# Patient Record
Sex: Female | Born: 1975 | Race: White | Hispanic: No | Marital: Married | Smoking: Never smoker
Health system: Southern US, Community
[De-identification: ages and names within clinical notes are randomized; demographics above are authoritative.]

## PROBLEM LIST (undated history)

## (undated) DIAGNOSIS — Z8739 Personal history of other diseases of the musculoskeletal system and connective tissue: Secondary | ICD-10-CM

## (undated) DIAGNOSIS — D649 Anemia, unspecified: Secondary | ICD-10-CM

## (undated) DIAGNOSIS — E739 Lactose intolerance, unspecified: Principal | ICD-10-CM

## (undated) DIAGNOSIS — D259 Leiomyoma of uterus, unspecified: Secondary | ICD-10-CM

## (undated) DIAGNOSIS — T7840XA Allergy, unspecified, initial encounter: Secondary | ICD-10-CM

## (undated) DIAGNOSIS — N6489 Other specified disorders of breast: Secondary | ICD-10-CM

## (undated) DIAGNOSIS — K621 Rectal polyp: Secondary | ICD-10-CM

## (undated) DIAGNOSIS — K529 Noninfective gastroenteritis and colitis, unspecified: Secondary | ICD-10-CM

## (undated) HISTORY — DX: Noninfective gastroenteritis and colitis, unspecified: K52.9

## (undated) HISTORY — DX: Anemia, unspecified: D64.9

## (undated) HISTORY — DX: Personal history of other diseases of the musculoskeletal system and connective tissue: Z87.39

## (undated) HISTORY — DX: Allergy, unspecified, initial encounter: T78.40XA

## (undated) HISTORY — PX: CYSTECTOMY: SUR359

## (undated) HISTORY — DX: Other specified disorders of breast: N64.89

## (undated) HISTORY — PX: URETHRAL CYST REMOVAL: SHX5128

## (undated) HISTORY — DX: Lactose intolerance, unspecified: E73.9

## (undated) HISTORY — DX: Leiomyoma of uterus, unspecified: D25.9

## (undated) HISTORY — DX: Rectal polyp: K62.1

---

## 2000-03-02 ENCOUNTER — Other Ambulatory Visit: Admission: RE | Admit: 2000-03-02 | Discharge: 2000-03-02 | Payer: Self-pay | Admitting: Obstetrics and Gynecology

## 2000-08-30 ENCOUNTER — Inpatient Hospital Stay (HOSPITAL_COMMUNITY): Admission: AD | Admit: 2000-08-30 | Discharge: 2000-09-01 | Payer: Self-pay | Admitting: Obstetrics and Gynecology

## 2000-09-29 ENCOUNTER — Other Ambulatory Visit: Admission: RE | Admit: 2000-09-29 | Discharge: 2000-09-29 | Payer: Self-pay | Admitting: Obstetrics and Gynecology

## 2001-10-06 ENCOUNTER — Other Ambulatory Visit: Admission: RE | Admit: 2001-10-06 | Discharge: 2001-10-06 | Payer: Self-pay | Admitting: Obstetrics and Gynecology

## 2003-03-07 ENCOUNTER — Other Ambulatory Visit: Admission: RE | Admit: 2003-03-07 | Discharge: 2003-03-07 | Payer: Self-pay | Admitting: Obstetrics and Gynecology

## 2003-05-04 ENCOUNTER — Emergency Department (HOSPITAL_COMMUNITY): Admission: EM | Admit: 2003-05-04 | Discharge: 2003-05-04 | Payer: Self-pay | Admitting: Emergency Medicine

## 2004-06-17 ENCOUNTER — Ambulatory Visit (HOSPITAL_BASED_OUTPATIENT_CLINIC_OR_DEPARTMENT_OTHER): Admission: RE | Admit: 2004-06-17 | Discharge: 2004-06-17 | Payer: Self-pay | Admitting: Urology

## 2004-06-17 ENCOUNTER — Ambulatory Visit (HOSPITAL_COMMUNITY): Admission: RE | Admit: 2004-06-17 | Discharge: 2004-06-17 | Payer: Self-pay | Admitting: Urology

## 2011-08-16 ENCOUNTER — Emergency Department (HOSPITAL_COMMUNITY)
Admission: EM | Admit: 2011-08-16 | Discharge: 2011-08-16 | Disposition: A | Discharge status: Home / Self Care | Payer: PRIVATE HEALTH INSURANCE | Attending: Emergency Medicine | Admitting: Emergency Medicine

## 2011-08-16 DIAGNOSIS — S0990XA Unspecified injury of head, initial encounter: Secondary | ICD-10-CM | POA: Insufficient documentation

## 2011-08-16 DIAGNOSIS — M546 Pain in thoracic spine: Secondary | ICD-10-CM | POA: Insufficient documentation

## 2011-08-16 DIAGNOSIS — S20229A Contusion of unspecified back wall of thorax, initial encounter: Secondary | ICD-10-CM | POA: Insufficient documentation

## 2011-08-16 DIAGNOSIS — Y9210 Unspecified residential institution as the place of occurrence of the external cause: Secondary | ICD-10-CM | POA: Insufficient documentation

## 2011-08-16 DIAGNOSIS — S1093XA Contusion of unspecified part of neck, initial encounter: Secondary | ICD-10-CM | POA: Insufficient documentation

## 2011-08-16 DIAGNOSIS — M542 Cervicalgia: Secondary | ICD-10-CM | POA: Insufficient documentation

## 2011-08-16 DIAGNOSIS — Y990 Civilian activity done for income or pay: Secondary | ICD-10-CM | POA: Insufficient documentation

## 2011-08-16 DIAGNOSIS — IMO0002 Reserved for concepts with insufficient information to code with codable children: Secondary | ICD-10-CM | POA: Insufficient documentation

## 2011-08-16 DIAGNOSIS — S0003XA Contusion of scalp, initial encounter: Secondary | ICD-10-CM | POA: Insufficient documentation

## 2012-01-05 ENCOUNTER — Encounter: Payer: Self-pay | Admitting: Internal Medicine

## 2012-01-07 ENCOUNTER — Other Ambulatory Visit (INDEPENDENT_AMBULATORY_CARE_PROVIDER_SITE_OTHER): Payer: BC Managed Care – PPO

## 2012-01-07 ENCOUNTER — Encounter: Payer: Self-pay | Admitting: Internal Medicine

## 2012-01-07 ENCOUNTER — Ambulatory Visit (INDEPENDENT_AMBULATORY_CARE_PROVIDER_SITE_OTHER): Payer: BC Managed Care – PPO | Admitting: Internal Medicine

## 2012-01-07 DIAGNOSIS — K921 Melena: Secondary | ICD-10-CM

## 2012-01-07 LAB — CBC WITH DIFFERENTIAL/PLATELET
Eosinophils Relative: 13.3 % — ABNORMAL HIGH (ref 0.0–5.0)
HCT: 34.8 % — ABNORMAL LOW (ref 36.0–46.0)
Hemoglobin: 11.4 g/dL — ABNORMAL LOW (ref 12.0–15.0)
Lymphs Abs: 1.6 10*3/uL (ref 0.7–4.0)
Monocytes Relative: 13.4 % — ABNORMAL HIGH (ref 3.0–12.0)
Neutro Abs: 5.3 10*3/uL (ref 1.4–7.7)
Platelets: 321 10*3/uL (ref 150.0–400.0)
WBC: 9.5 10*3/uL (ref 4.5–10.5)

## 2012-01-07 MED ORDER — PEG-KCL-NACL-NASULF-NA ASC-C 100 G PO SOLR: 1.0000 | Freq: Once | ORAL | Status: DC

## 2012-01-07 NOTE — Patient Instructions (Signed)
Please go to the basement upon leaving today to have your labs done. You have been scheduled for a Colonoscopy with separate instructions given. Your prep kit has been sent to your pharmacy for you to pick up.

## 2012-01-07 NOTE — Progress Notes (Signed)
Quick Note:  Let her know Hgb result - slightly low. Not a major issue. Will discuss more at colonoscopy ______

## 2012-01-07 NOTE — Progress Notes (Signed)
Subjective:    Patient ID: Gloria Fox, female    DOB: [birth year], 36 y.o.   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 It started in November, and became more frequent. It's really almost a daily feeding where she has some urgent defecation with mucus and slightly loose stools and blood mixed in. The blood has been red in color. There is no report of melena. She has had some constipation type sensation to times. There's been some mild crampy lower quadrant abdominal pain. She reports a history of hemorrhoids since childbirth, most recently in 2001, with some minor rectal bleeding but nothing like this in the past. Her GI review of systems is otherwise negative, without nausea or vomiting, weight changes, or other significant constitutional symptoms. She has some mild ankle pain, she has some chronic low back pain which he attributes to think she doesn't work but there is no history of rash, ocular inflammation or true arthritis.  Allergies  Allergen Reactions  . Keflex   . Sulfa Drugs Cross Reactors    No outpatient prescriptions prior to visit.   No past medical history on file. Past Surgical History  Procedure Date  . Cystectomy    History   Social History  . Marital Status: Married    Spouse Name: N/A    Number of Children: 2       Occupational History  . Nurse [employer]   Social History Main Topics  . Smoking status: Never Smoker   . Smokeless tobacco: Never Used  . Alcohol Use: No  . Drug Use: No  .            Social History Narrative   Daily caffeine    Family History  Problem Relation Age of Onset  . Colon cancer Neg Hx          Review of Systems She did recently have a URI syndrome with fever cough and headaches but no myalgias. She reports an allergy issues at times. She wears eyeglasses. All other review of systems negative  or as per the history of present illness.    Objective:   Physical Exam General:  Well-developed, well-nourished and in no acute distress Eyes:  anicteric. ENT:   Mouth and posterior pharynx free of lesions.  Neck:   supple w/o thyromegaly or mass.  Lungs: Clear to auscultation bilaterally. Heart:  S1S2, no rubs, murmurs, gallops. Abdomen:  soft, non-tender, no hepatosplenomegaly, hernia, or mass and BS+.  Rectal: Deferred until colonoscopy Lymph:  no cervical or supraclavicular adenopathy. Extremities:   no edema Skin   no rash. Neuro:  A&O x 3.  Psych:  appropriate mood and  Affect.       Assessment & Plan:   1. Blood in stool    This is associated with some change in bowel habits with mucus production. It sounds like a small volume bleeding issue. The differential diagnosis includes inflammatory bowel disease, it sounds like she at least has a proctitis. She could have some associated hemorrhoidal bleeding as well but the mucus and change in bowels is suggestive of a proctitis and possible colitis. Colorectal neoplasia is in the differential as well but seems much less likely.  We have discussed the options of flexible sigmoidoscopy versus colonoscopy in the information and risks associated with each. Colonoscopy with possible terminal ileum inspection has been  decided upon. She understands the risks benefits and indications and agrees to proceed. We'll also check a CBC at this point. Depending upon what is found he could need more investigation but I think this is enough for now.

## 2012-01-12 ENCOUNTER — Ambulatory Visit (AMBULATORY_SURGERY_CENTER): Payer: BC Managed Care – PPO | Admitting: Internal Medicine

## 2012-01-12 ENCOUNTER — Encounter: Payer: Self-pay | Admitting: Internal Medicine

## 2012-01-12 ENCOUNTER — Other Ambulatory Visit (INDEPENDENT_AMBULATORY_CARE_PROVIDER_SITE_OTHER): Payer: BC Managed Care – PPO

## 2012-01-12 VITALS — BP 141/70 | HR 120 | Temp 100.6°F | Resp 20 | Ht 61.0 in | Wt 112.0 lb

## 2012-01-12 DIAGNOSIS — K5190 Ulcerative colitis, unspecified, without complications: Secondary | ICD-10-CM

## 2012-01-12 DIAGNOSIS — K921 Melena: Secondary | ICD-10-CM

## 2012-01-12 DIAGNOSIS — K620 Anal polyp: Secondary | ICD-10-CM

## 2012-01-12 DIAGNOSIS — K5150 Left sided colitis without complications: Secondary | ICD-10-CM

## 2012-01-12 DIAGNOSIS — K621 Rectal polyp: Secondary | ICD-10-CM

## 2012-01-12 DIAGNOSIS — K529 Noninfective gastroenteritis and colitis, unspecified: Secondary | ICD-10-CM

## 2012-01-12 DIAGNOSIS — D126 Benign neoplasm of colon, unspecified: Secondary | ICD-10-CM

## 2012-01-12 HISTORY — PX: COLONOSCOPY: SHX174

## 2012-01-12 HISTORY — DX: Rectal polyp: K62.1

## 2012-01-12 HISTORY — DX: Noninfective gastroenteritis and colitis, unspecified: K52.9

## 2012-01-12 LAB — COMPREHENSIVE METABOLIC PANEL
ALT: 9 U/L (ref 0–35)
AST: 15 U/L (ref 0–37)
Albumin: 2.5 g/dL — ABNORMAL LOW (ref 3.5–5.2)
Alkaline Phosphatase: 49 U/L (ref 39–117)
Potassium: 3.2 mEq/L — ABNORMAL LOW (ref 3.5–5.1)
Sodium: 137 mEq/L (ref 135–145)
Total Protein: 5.1 g/dL — ABNORMAL LOW (ref 6.0–8.3)

## 2012-01-12 MED ORDER — MESALAMINE 800 MG PO TBEC: 2.0000 | DELAYED_RELEASE_TABLET | Freq: Three times a day (TID) | ORAL | Status: DC

## 2012-01-12 MED ORDER — SODIUM CHLORIDE 0.9 % IV SOLN: 500.0000 mL | INTRAVENOUS | Status: DC

## 2012-01-12 NOTE — Patient Instructions (Addendum)
The colonoscopy shows what I think is left-sided ulcerative colitis. I think it makes sense to start treatment and I will also notify you when the biopsies return. Asacol HD was prescribed. You will have a CMET today also. Please read the information provided today. Gatha Mayer, MD, FACG   YOU HAD AN ENDOSCOPIC PROCEDURE TODAY AT [facility]: Refer to the procedure report that was given to you for any specific questions about what was found during the examination.  If the procedure report does not answer your questions, please call your gastroenterologist to clarify.  If you requested that your care partner not be given the details of your procedure findings, then the procedure report has been included in a sealed envelope for you to review at your convenience later.  YOU SHOULD EXPECT: Some feelings of bloating in the abdomen. Passage of more gas than usual.  Walking can help get rid of the air that was put into your GI tract during the procedure and reduce the bloating. If you had a lower endoscopy (such as a colonoscopy or flexible sigmoidoscopy) you may notice spotting of blood in your stool or on the toilet paper. If you underwent a bowel prep for your procedure, then you may not have a normal bowel movement for a few days.  DIET: Your first meal following the procedure should be a light meal and then it is ok to progress to your normal diet.  A half-sandwich or bowl of soup is an example of a good first meal.  Heavy or fried foods are harder to digest and may make you feel nauseous or bloated.  Likewise meals heavy in dairy and vegetables can cause extra gas to form and this can also increase the bloating.  Drink plenty of fluids but you should avoid alcoholic beverages for 24 hours.  ACTIVITY: Your care partner should take you home directly after the procedure.  You should plan to take it easy, moving slowly for the rest of the day.  You can resume normal activity the day after  the procedure however you should NOT DRIVE or use heavy machinery for 24 hours (because of the sedation medicines used during the test).    SYMPTOMS TO REPORT IMMEDIATELY: A gastroenterologist can be reached at any hour.  During normal business hours, 8:30 AM to 5:00 PM Monday through Friday, call [phone].  After hours and on weekends, please call the GI answering service at [phone] who will take a message and have the physician on call contact you.   Following lower endoscopy (colonoscopy or flexible sigmoidoscopy):  Excessive amounts of blood in the stool  Significant tenderness or worsening of abdominal pains  Swelling of the abdomen that is new, acute  Fever of 100F or higher  Following upper endoscopy (EGD)  Vomiting of blood or coffee ground material  New chest pain or pain under the shoulder blades  Painful or persistently difficult swallowing  New shortness of breath  Fever of 100F or higher  Black, tarry-looking stools  FOLLOW UP: If any biopsies were taken you will be contacted by phone or by letter within the next 1-3 weeks.  Call your gastroenterologist if you have not heard about the biopsies in 3 weeks.  Our staff will call the home number listed on your records the next business day following your procedure to check on you and address any questions or concerns that you may have at that time regarding the information given to you  following your procedure. This is a courtesy call and so if there is no answer at the home number and we have not heard from you through the emergency physician on call, we will assume that you have returned to your regular daily activities without incident.  SIGNATURES/CONFIDENTIALITY: You and/or your care partner have signed paperwork which will be entered into your electronic medical record.  These signatures attest to the fact that that the information above on your After Visit Summary has been reviewed and is understood.  Full  responsibility of the confidentiality of this discharge information lies with you and/or your care-partner.    Ulcerative Colitis Ulcerative colitis is a long lasting swelling and soreness (inflammation) of the colon (large intestine). In patients with ulcerative colitis, sores (ulcers) and inflammation of the inner lining of the colon lead to illness. Ulcerative colitis can also cause problems outside the digestive tract.  Ulcerative colitis is closely related to another condition of inflammation of the intestines called Crohn's disease. Together, they are frequently referred to as inflammatory bowel disease (IBD). Ulcerative colitis and Crohn's diseases are conditions that can last years to decades. Men and women are affected equally. They most commonly begin during adolescence and early adulthood. SYMPTOMS  Common symptoms of ulcerative colitis include rectal bleeding and diarrhea. There is a wide range of symptoms among patients with this disease depending on how severe the disease is. Some of these symptoms are:  Abdominal pain or cramping.   Diarrhea.   Fever.   Tiredness (fatigue).   Weight loss.   Night sweats.   Rectal pain.   Feeling the immediate need to have a bowel movement (rectal urgency).  CAUSES  Ulcerative colitis is caused by increased activity of the immune system in the intestines. The immune system is the system that protects the body against disease such as harmful bacteria, viruses, fungi, and other foreign invaders. When the immune system overacts, it causes inflammation. The cause of the increased immune system activity is not known. This over activity causes long-lasting inflammation and ulceration. This condition may be passed down from your parents (inherited). Brothers, sisters, children, and parents of patients with IBD are more likely to develop these diseases. It is not contagious. This means you cannot catch it from someone else. DIAGNOSIS  Your caregiver  may suspect ulcerative colitis based on your symptoms and exam. Blood tests may confirm that there is a problem. You may be asked to submit a stool specimen for examination. X-rays and CT scans may be necessary. Ultimately, the diagnosis is usually made after a flexible tube is inserted via your anus and your colon is examined under sedation (colonoscopy). With this test, the specialist can take a tiny tissue sample from inside the bowel (biopsy). Examination of this biopsy tissue under a microscopy can reveal ulcerative colitis as the cause of your symptoms. TREATMENT   There is no cure for ulcerative colitis.   Complications such as massive bleeding from the colon (hemorrhage), development of a hole in the colon (perforation), or the development of precancerous or cancerous changes of the colon may require surgery.   Medications are often used to decrease inflammation and control the immune system. These include medicines related to aspirin, steroid medications, and newer and stronger medications to slow down the immune system. Some medications may be used as suppositories or enemas. A number of other medications are used or have been studied. Your caregiver will make specific recommendations.  HOME CARE INSTRUCTIONS   There is  no cure for ulcerative colitis disease. The best treatment is frequent checkups with your caregiver. Periodic reevaluation is important.   Symptoms such as diarrhea can be controlled with medications. Avoid foods that have a laxative effect such fresh fruit and vegetables and dairy products. During flare ups, you can rest your bowel by staying away from solid foods. Drink clear liquids frequently during the day. Electrolyte or rehydrating fluids are best. Your caregiver can help you with suggestions. Drink often to prevent dehydration. When diarrhea has cleared, eat smaller meals and more often. Avoid food additives and stimulants such as caffeine (coffee, tea, many sodas, or  chocolate). Avoid dairy products. Enzyme supplements may help if you develop intolerance to a sugar in dairy products (lactose). Ask your caregiver or dietitian about specific dietary instructions.   If you had surgery, be sure you understand your care instructions thoroughly, including proper care of any surgical wounds.   Take any medications exactly as prescribed.   Try to maintain a positive attitude. Learn relaxation techniques such as self hypnosis, mental imaging, and muscle relaxation. If possible, avoid stresses that aggravate your condition. Exercise regularly. Follow your diet. Always get plenty of rest.  SEEK MEDICAL CARE IF:   Your symptoms fail to improve after a week or two of new treatment.   You experience continued weight loss.   You have ongoing crampy digestion or loose bowels.   You develop a new skin rash, skin sores, or eye problems.  SEEK IMMEDIATE MEDICAL CARE IF:   You have worsening of your symptoms or develop new symptoms.   You have an oral temperature above 102 F (38.9 C), not controlled by medicine.   You develop bloody diarrhea.   You have severe abdominal pain.  Document Released: 08/18/2005 Document Revised: 07/21/2011 Document Reviewed: 07/18/2007 [facility] Patient Information 2012 [place].  You will have labs drawn today on discharge.   Colon Polyps A polyp is extra tissue that grows inside your body. Colon polyps grow in the large intestine. The large intestine, also called the colon, is part of your digestive system. It is a long, hollow tube at the end of your digestive tract where your body makes and stores stool. Most polyps are not dangerous. They are benign. This means they are not cancerous. But over time, some types of polyps can turn into cancer. Polyps that are smaller than a pea are usually not harmful. But larger polyps could someday become or may already be cancerous. To be safe, doctors remove all polyps and test them.  WHO  GETS POLYPS? Anyone can get polyps, but certain people are more likely than others. You may have a greater chance of getting polyps if:  You are over 50.   You have had polyps before.   Someone in your family has had polyps.   Someone in your family has had cancer of the large intestine.   Find out if someone in your family has had polyps. You may also be more likely to get polyps if you:   Eat a lot of fatty foods.   Smoke.   Drink alcohol.   Do not exercise.   Eat too much.  SYMPTOMS  Most small polyps do not cause symptoms. People often do not know they have one until their caregiver finds it during a regular checkup or while testing them for something else. Some people do have symptoms like these:  Bleeding from the anus. You might notice blood on your underwear or on  toilet paper after you have had a bowel movement.   Constipation or diarrhea that lasts more than a week.   Blood in the stool. Blood can make stool look black or it can show up as red streaks in the stool.  If you have any of these symptoms, see your caregiver. HOW DOES THE DOCTOR TEST FOR POLYPS? The doctor can use four tests to check for polyps:  Digital rectal exam. The caregiver wears gloves and checks your rectum (the last part of the large intestine) to see if it feels normal. This test would find polyps only in the rectum. Your caregiver may need to do one of the other tests listed below to find polyps higher up in the intestine.   Barium enema. The caregiver puts a liquid called barium into your rectum before taking x-rays of your large intestine. Barium makes your intestine look white in the pictures. Polyps are dark, so they are easy to see.   Sigmoidoscopy. With this test, the caregiver can see inside your large intestine. A thin flexible tube is placed into your rectum. The device is called a sigmoidoscope, which has a light and a tiny video camera in it. The caregiver uses the sigmoidoscope to  look at the last third of your large intestine.   Colonoscopy. This test is like sigmoidoscopy, but the caregiver looks at all of the large intestine. It usually requires sedation. This is the most common method for finding and removing polyps.  TREATMENT   The caregiver will remove the polyp during sigmoidoscopy or colonoscopy. The polyp is then tested for cancer.   If you have had polyps, your caregiver may want you to get tested regularly in the future.  PREVENTION  There is not one sure way to prevent polyps. You might be able to lower your risk of getting them if you:  Eat more fruits and vegetables and less fatty food.   Do not smoke.   Avoid alcohol.   Exercise every day.   Lose weight if you are overweight.   Eating more calcium and folate can also lower your risk of getting polyps. Some foods that are rich in calcium are milk, cheese, and broccoli. Some foods that are rich in folate are chickpeas, kidney beans, and spinach.   Aspirin might help prevent polyps. Studies are under way.  Document Released: 08/04/2004 Document Revised: 07/21/2011 Document Reviewed: 01/10/2008 [facility] Patient Information 2012 [place].

## 2012-01-12 NOTE — Progress Notes (Signed)
Patient did not experience any of the following events: a burn prior to discharge; a fall within the facility; wrong site/side/patient/procedure/implant event; or a hospital transfer or hospital admission upon discharge from the facility. (G8907) Patient did not have preoperative order for IV antibiotic SSI prophylaxis. (G8918)  

## 2012-01-12 NOTE — Op Note (Signed)
[facility] [address]. [place], [place]  [address]  COLONOSCOPY PROCEDURE REPORT  PATIENT:  Gloria Fox, Gloria Fox  MR#:  [id number] BIRTHDATE:  03-14-1976, 35 yrs. old  GENDER:  female ENDOSCOPIST:  Gatha Mayer, MD, [facility]  PROCEDURE DATE:  01/12/2012 PROCEDURE:  Colonoscopy with biopsy ASA CLASS:  Class I INDICATIONS:  hematochezia MEDICATIONS:   These medications were titrated to patient response per physician's verbal order, Fentanyl 100 mcg IV, Versed 9 mg IV  DESCRIPTION OF PROCEDURE:   After the risks benefits and alternatives of the procedure were thoroughly explained, informed consent was obtained.  Digital rectal exam was performed and revealed no rectal masses and tender.   The LB [id number] endoscope was introduced through the anus and advanced to the terminal ileum which was intubated for a short distance, without limitations.  The quality of the prep was adequate, using MoviPrep.  The instrument was then slowly withdrawn as the colon was fully examined. <<PROCEDUREIMAGES>>  FINDINGS:  Colitis was found in the left colon. Anal verge to about 60 cm. Confluent ulcerations, edema, erythema and minimal contact friability. Multiple biopsies were obtained and sent to pathology.  A sessile polyp was found in the rectum. 1 cm - soft, fleshy and much removed with biopsy. Suspect inflammatory.  The terminal ileum appeared normal.  This was otherwise a normal examination of the colon. Random biopsies were obtained and sent to pathology.   Retroflexed views in the rectum revealed it was not tolerated by the patient.    The time to cecum =   4:48 minutes. The scope was then withdrawn in 7:45 minutes from the cecum and the procedure completed. COMPLICATIONS:  None ENDOSCOPIC IMPRESSION: 1) Colitis in the left colon - looks like left-sided ulcerative colitis 2) Sessile polyp in the rectum - suspect inflammatoty 3) Normal terminal ileum 4) Otherwise normal  examination RECOMMENDATIONS: 1) start Asacol HD 1600 mg tid 2) CMET today 3) will contact with results and plans - anticipate office visit in 6-8 weeks  Gatha Mayer, MD, [facility]  CC:  The Patient  n. eSIGNED:   Gatha Mayer at 01/12/2012 04:19 PM  Oleta Mouse, [phone]

## 2012-01-13 ENCOUNTER — Telehealth: Payer: Self-pay | Admitting: *Deleted

## 2012-01-13 NOTE — Telephone Encounter (Signed)
  Follow up Call-  Call back number 01/12/2012  Post procedure Call Back phone  # [phone]  Permission to leave phone message Yes     Patient questions:  Do you have a fever, pain , or abdominal swelling? no Pain Score  0 *  Have you tolerated food without any problems? yes  Have you been able to return to your normal activities? yes  Do you have any questions about your discharge instructions: Diet   no Medications  no Follow up visit  no  Do you have questions or concerns about your Care? no  Actions: * If pain score is 4 or above: No action needed, pain <4.

## 2012-01-13 NOTE — Progress Notes (Signed)
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 Bananas) Albumin and protein are low from chronic diarrhea/UC   ______

## 2012-01-17 NOTE — Progress Notes (Signed)
Quick Note:  Office -- please call patient with info below  Bxs show left-sided ulcerative colitis and inflammatory polyp  How is she doing? Any better? (probably not but checking to see ) - has a school trip to [place] March 11 so ? If needs faster-acting tx  She does need a 6-8 week appt  [facility] - no letter, no recall at this time ______

## 2012-01-18 ENCOUNTER — Other Ambulatory Visit: Payer: Self-pay

## 2012-01-18 DIAGNOSIS — K5150 Left sided colitis without complications: Secondary | ICD-10-CM

## 2012-01-18 MED ORDER — MESALAMINE 800 MG PO TBEC: 2.0000 | DELAYED_RELEASE_TABLET | Freq: Three times a day (TID) | ORAL | Status: DC

## 2012-01-18 MED ORDER — PREDNISONE (PAK) 10 MG PO TABS: ORAL_TABLET | ORAL | Status: DC

## 2012-01-18 NOTE — Progress Notes (Signed)
Quick Note:  Prednisone 10 mg tablets   4/day x 5 days, 3/day x 5 days, 2/day x 10 days, 1/day x 10 days, 1/2/day x 10 days and stop. Rx # enough to cover this  If not helping her in next 1-2 weeks call back sooner ______

## 2012-03-03 ENCOUNTER — Ambulatory Visit (INDEPENDENT_AMBULATORY_CARE_PROVIDER_SITE_OTHER): Payer: BC Managed Care – PPO | Admitting: Internal Medicine

## 2012-03-03 ENCOUNTER — Encounter: Payer: Self-pay | Admitting: Internal Medicine

## 2012-03-03 VITALS — BP 108/68 | HR 80 | Ht 62.0 in | Wt 115.0 lb

## 2012-03-03 DIAGNOSIS — K5150 Left sided colitis without complications: Secondary | ICD-10-CM

## 2012-03-03 NOTE — Progress Notes (Signed)
Patient ID: Gloria Fox, female   DOB: 08-04-1976, 36 y.o.   MRN: [id number]   Hx:  The patient returns for followup of left-sided ulcerative colitis that was diagnosed at colonoscopy in February 2013. She was started on Asacol HD 1600 mg 3 times a day. She also took a brief course of prednisone because of acute symptoms with severe diarrhea. She responded nicely to both of these, she was able to go on a field trip with her daughter back in March. At this point she has 1 or 2 formed stools a day with slight mucus production. When she's had a large stool she's had a streak of blood seen. No other bleeding. Note, pain. Energy level is good and she feels essentially back to normal.  She has no other complaints today.  Medications, allergies, past medical history, past surgical history, family history and social history are reviewed and updated in the EMR.  As until exam not performed today.  Assessment and plan:  Left sided ulcerative colitis, improved. See problem oriented charting as well. Anticipate routine followup in one year, sooner as needed.

## 2012-03-03 NOTE — Assessment & Plan Note (Signed)
Other than occasional mucus and a rare streak of red blood with a hard stool, she is doing very well. It sounds like she is in or very close to remission. We'll continue Asacol H D. at 4.8 g daily. We discussed that she might get by with a lower dose but would not consider reducing the dose until later this year. I have reviewed the nature of the illness, she has read on her own. Crohn's and colitis foundation member ship benefit provided to the patient for further information.  She does not need bone density screening or any other special measures in her situation. Routine colorectal screening would be appropriate around age 62 for her and she has left-sided disease. Risk of progression of disease explained. We did discuss possible flexible sigmoidoscopy in the future to confirm disease control. She is open to that idea. I told her that it is not a standard of care but there are differing opinions on the appropriateness of that.

## 2012-03-03 NOTE — Patient Instructions (Signed)
You have been given a prescription for one year complimentary [facility] membership for your Ulcerative Colitis.  Follow-up with [place] in one year or sooner if needed.

## 2012-08-04 ENCOUNTER — Telehealth: Payer: Self-pay | Admitting: Internal Medicine

## 2012-08-04 MED ORDER — BUDESONIDE 9 MG PO TB24: 9.0000 mg | ORAL_TABLET | Freq: Every day | ORAL | Status: DC

## 2012-08-04 NOTE — Telephone Encounter (Signed)
I recommend Uceris 9 mg daily - helps like prednisone but without as many side effects # 30 1 refill If too expensive will need to do prednisone - 30 mg x 5 days 20 mg x 5 days 10 mg x 10 days 5 mg x 10 days  Office visit with me in 1 month - call back sooner if not responding

## 2012-08-04 NOTE — Telephone Encounter (Signed)
Patient aware.  REV scheduled for 09/06/12.  She will call back for any questions

## 2012-08-04 NOTE — Telephone Encounter (Signed)
Patient reports that she had a 3 week hx of mucous and blood in her stool.  The is also experiencing LLQ pain.  She is currently on  Asacol1600 mg TID.  She reports that she recently lost her Grandfather and these symptoms started around that time.  Dr. [name] please advise

## 2012-08-22 ENCOUNTER — Telehealth: Payer: Self-pay | Admitting: Internal Medicine

## 2012-08-22 DIAGNOSIS — R197 Diarrhea, unspecified: Secondary | ICD-10-CM

## 2012-08-22 MED ORDER — PREDNISONE 10 MG PO TABS: ORAL_TABLET | ORAL | Status: DC

## 2012-08-22 NOTE — Telephone Encounter (Signed)
Have her use the prednisone taper I wrote on last note (stop Uceris) Also ask her to do a C diff PCR

## 2012-08-22 NOTE — Telephone Encounter (Signed)
Patient reports that diarrhea has gotten worse since starting on Uceris.  She is having 3-4 loose stools every am and there is still some bleeding. See telephone call from 08/04/12 Dr. [name] please advise.

## 2012-08-22 NOTE — Telephone Encounter (Signed)
Patient aware rx sent She will come for stool studies

## 2012-09-05 ENCOUNTER — Other Ambulatory Visit: Payer: BC Managed Care – PPO

## 2012-09-05 DIAGNOSIS — R197 Diarrhea, unspecified: Secondary | ICD-10-CM

## 2012-09-06 ENCOUNTER — Ambulatory Visit (INDEPENDENT_AMBULATORY_CARE_PROVIDER_SITE_OTHER): Payer: BC Managed Care – PPO | Admitting: Internal Medicine

## 2012-09-06 ENCOUNTER — Encounter: Payer: Self-pay | Admitting: Internal Medicine

## 2012-09-06 VITALS — BP 92/60 | HR 68 | Ht 62.0 in | Wt 118.6 lb

## 2012-09-06 DIAGNOSIS — K5150 Left sided colitis without complications: Secondary | ICD-10-CM

## 2012-09-06 MED ORDER — MESALAMINE 1000 MG RE SUPP: 1000.0000 mg | Freq: Every day | RECTAL | Status: DC

## 2012-09-06 MED ORDER — PREDNISONE 10 MG PO TABS: ORAL_TABLET | ORAL | Status: DC

## 2012-09-06 NOTE — Patient Instructions (Addendum)
We have sent the following medications to your pharmacy for you to pick up at your convenience: Canasa suppository.  Take your Prednisone as follows:  71m every day for 2 weeks, 130mevery day for 2 weeks, 5 mg every day for 2 weeks then stop.  Thank you for choosing [facility] and [facility].  CaGatha MayerM.D., FAN[facility]

## 2012-09-06 NOTE — Progress Notes (Signed)
  Subjective:    Patient ID: Gloria Fox, female    DOB: June 18, 1976, 36 y.o.   MRN: [id number]  HPI The patient presents for followup of left-sided ulcerative colitis. She had been well since the spring. Within the past several weeks she developed diarrhea and rectal bleeding. She says it has been a stressful time, she was driving to [facility] and caring for a grandparent who is dying And she was quite busy. Now her husband's grandfather is also ill but is now moved to hospice that [facility] place and she thinks distress is less with that but still there. She was started on budesonide but that did not seem to help much. He was still having diarrhea so she was switched to prednisone. A relatively short tapered and is now on 10 mg daily about 2 weeks into that. She is not having diarrhea but is seeing some slight blood at times intermittently. Still having some crampy lower quadrant pain.   Review of Systems As above    Objective:   Physical Exam Well-developed well-nourished young white woman in no acute distress Abdomen is soft seems nontender today no organomegaly or mass       Assessment & Plan:   1. Left sided ulcerative (chronic) colitis with flare, related to recent stressors. She is on maximal mesalamine at 4.8 g daily and is generally compliant she says. Especially now. She is improved on prednisone but the taper was probably too quickly. Will prolong this using 20 mg daily for 2 weeks then 10 mg daily for 2 weeks and then 5 mg daily. I will also add Canasa suppository nightly. If this fails or she has more problems we may need to go to immune modulator therapy. We have discussed azathioprine and mercaptopurine to some degree. She will followup as needed at this point she understands the plan and can call or contact us by my chart.

## 2012-10-23 ENCOUNTER — Other Ambulatory Visit: Payer: Self-pay

## 2012-10-23 DIAGNOSIS — K5150 Left sided colitis without complications: Secondary | ICD-10-CM

## 2012-10-23 MED ORDER — MESALAMINE 1000 MG RE SUPP: 1000.0000 mg | Freq: Every day | RECTAL | Status: DC

## 2012-10-23 NOTE — Telephone Encounter (Signed)
Ok per Dr. [name], rx form faxed back to [facility] at [phone].

## 2013-01-06 ENCOUNTER — Other Ambulatory Visit: Payer: Self-pay

## 2013-02-26 ENCOUNTER — Other Ambulatory Visit: Payer: Self-pay

## 2013-02-26 DIAGNOSIS — K5150 Left sided colitis without complications: Secondary | ICD-10-CM

## 2013-02-26 MED ORDER — MESALAMINE 800 MG PO TBEC: 2.0000 | DELAYED_RELEASE_TABLET | Freq: Three times a day (TID) | ORAL | Status: DC

## 2013-02-26 NOTE — Telephone Encounter (Signed)
Faxed refill ok back on 02-23-13 for the Asacol HD 846m, ok per Dr. CMady Gemma Form sent down to be scanned in.

## 2013-09-27 ENCOUNTER — Other Ambulatory Visit: Payer: Self-pay

## 2013-10-24 ENCOUNTER — Ambulatory Visit: Payer: PRIVATE HEALTH INSURANCE | Attending: Occupational Medicine | Admitting: Physical Therapy

## 2013-10-24 DIAGNOSIS — M25519 Pain in unspecified shoulder: Secondary | ICD-10-CM | POA: Insufficient documentation

## 2013-10-24 DIAGNOSIS — IMO0001 Reserved for inherently not codable concepts without codable children: Secondary | ICD-10-CM | POA: Insufficient documentation

## 2013-10-30 ENCOUNTER — Encounter: Payer: PRIVATE HEALTH INSURANCE | Admitting: Rehabilitation

## 2013-10-30 ENCOUNTER — Ambulatory Visit: Payer: PRIVATE HEALTH INSURANCE | Admitting: Physical Therapy

## 2013-11-01 ENCOUNTER — Ambulatory Visit: Payer: PRIVATE HEALTH INSURANCE | Admitting: Rehabilitation

## 2013-11-06 ENCOUNTER — Ambulatory Visit: Payer: PRIVATE HEALTH INSURANCE | Admitting: Rehabilitation

## 2013-11-13 ENCOUNTER — Ambulatory Visit: Payer: PRIVATE HEALTH INSURANCE | Admitting: Physical Therapy

## 2013-11-20 ENCOUNTER — Ambulatory Visit: Payer: PRIVATE HEALTH INSURANCE | Admitting: Rehabilitation

## 2014-04-03 ENCOUNTER — Other Ambulatory Visit (INDEPENDENT_AMBULATORY_CARE_PROVIDER_SITE_OTHER): Payer: BC Managed Care – PPO

## 2014-04-03 ENCOUNTER — Ambulatory Visit (INDEPENDENT_AMBULATORY_CARE_PROVIDER_SITE_OTHER): Payer: BC Managed Care – PPO | Admitting: Internal Medicine

## 2014-04-03 ENCOUNTER — Encounter: Payer: Self-pay | Admitting: Internal Medicine

## 2014-04-03 VITALS — BP 118/78 | HR 78 | Ht 61.0 in | Wt 116.2 lb

## 2014-04-03 DIAGNOSIS — K5150 Left sided colitis without complications: Secondary | ICD-10-CM

## 2014-04-03 LAB — CBC WITH DIFFERENTIAL/PLATELET
BASOS ABS: 0 10*3/uL (ref 0.0–0.1)
Basophils Relative: 0.5 % (ref 0.0–3.0)
EOS ABS: 0.3 10*3/uL (ref 0.0–0.7)
Eosinophils Relative: 4.5 % (ref 0.0–5.0)
HEMATOCRIT: 41.4 % (ref 36.0–46.0)
HEMOGLOBIN: 14 g/dL (ref 12.0–15.0)
LYMPHS ABS: 1.5 10*3/uL (ref 0.7–4.0)
Lymphocytes Relative: 24.4 % (ref 12.0–46.0)
MCHC: 33.8 g/dL (ref 30.0–36.0)
MCV: 89.5 fl (ref 78.0–100.0)
MONOS PCT: 7.9 % (ref 3.0–12.0)
Monocytes Absolute: 0.5 10*3/uL (ref 0.1–1.0)
NEUTROS ABS: 3.8 10*3/uL (ref 1.4–7.7)
Neutrophils Relative %: 62.7 % (ref 43.0–77.0)
PLATELETS: 204 10*3/uL (ref 150.0–400.0)
RBC: 4.62 Mil/uL (ref 3.87–5.11)
RDW: 14.2 % (ref 11.5–15.5)
WBC: 6.1 10*3/uL (ref 4.0–10.5)

## 2014-04-03 LAB — COMPREHENSIVE METABOLIC PANEL
ALK PHOS: 61 U/L (ref 39–117)
ALT: 15 U/L (ref 0–35)
AST: 18 U/L (ref 0–37)
Albumin: 4.1 g/dL (ref 3.5–5.2)
BILIRUBIN TOTAL: 0.9 mg/dL (ref 0.2–1.2)
BUN: 14 mg/dL (ref 6–23)
CO2: 27 mEq/L (ref 19–32)
CREATININE: 0.7 mg/dL (ref 0.4–1.2)
Calcium: 9 mg/dL (ref 8.4–10.5)
Chloride: 106 mEq/L (ref 96–112)
GFR: 104.88 mL/min (ref >60.00)
Glucose, Bld: 79 mg/dL (ref 70–99)
Potassium: 3.9 mEq/L (ref 3.5–5.1)
SODIUM: 140 meq/L (ref 135–145)
TOTAL PROTEIN: 7 g/dL (ref 6.0–8.3)

## 2014-04-03 MED ORDER — MESALAMINE 1000 MG RE SUPP: 1000.0000 mg | Freq: Every day | RECTAL | Status: DC

## 2014-04-03 MED ORDER — MESALAMINE 800 MG PO TBEC: 2.0000 | DELAYED_RELEASE_TABLET | Freq: Three times a day (TID) | ORAL | Status: DC

## 2014-04-03 NOTE — Assessment & Plan Note (Signed)
Doing well with occasional intermittent Canasa suppository use. We'll continue Asacol HD at current dosing she will do 3 twice a day. Canasa intermittently. Check CBC metabolic panel and vitamin D level today. Return to clinic in one year sooner if needed.

## 2014-04-03 NOTE — Progress Notes (Signed)
         Subjective:    Patient ID: Gloria Fox, female    DOB: 1976/09/01, 38 y.o.   MRN: [id number]  HPI Patient is here without complaints for followup of left-sided ulcerative colitis. She plans on getting a primary care provider soon. She says she's up-to-date with Pap smears. Occasional rectal bleeding, we'll use Canasa intermittently with success otherwise in general no diarrhea or abdominal pain though tight fitting close a bother her some.  Review of Systems As above.    Objective:   Physical Exam General:  Thin, petite white woman NAD Eyes:   anicteric Lungs:  clear Heart:  S1S2 no rubs, murmurs or gallops Abdomen:  soft and nontender, BS+ Ext:   no edema      Assessment & Plan:  Left sided ulcerative colitis Doing well with occasional intermittent Canasa suppository use. We'll continue Asacol HD at current dosing she will do 3 twice a day. Canasa intermittently. Check CBC metabolic panel and vitamin D level today. Return to clinic in one year sooner if needed.

## 2014-04-03 NOTE — Patient Instructions (Signed)
Glad things are going ok. Labs today are CBC, CMET and vitamin D level. Canasa and Asacol HD are refilled.  I appreciate the opportunity to care for you. Gloria Mayer, MD, [facility]

## 2014-04-04 LAB — VITAMIN D 25 HYDROXY (VIT D DEFICIENCY, FRACTURES): VIT D 25 HYDROXY: 34 ng/mL (ref 30–89)

## 2014-04-04 NOTE — Progress Notes (Signed)
Quick Note:  Labs ok Notified by My Chart ______

## 2014-04-05 ENCOUNTER — Other Ambulatory Visit: Payer: Self-pay | Admitting: Internal Medicine

## 2014-10-14 ENCOUNTER — Telehealth: Payer: Self-pay | Admitting: Internal Medicine

## 2014-10-14 NOTE — Telephone Encounter (Signed)
Lialda 1.2 g tabs/caps  Take 2 bid  90 day Rx 3 RF ok

## 2014-10-14 NOTE — Telephone Encounter (Signed)
Which do you prefer Sir for her, thank you.

## 2014-10-15 NOTE — Telephone Encounter (Signed)
Spoke with patient and she just ordered 3 months supply of her Asacol so won't need the Lialda until Feb. 2016, she is going to call us back when she needs this sent in.

## 2014-12-23 ENCOUNTER — Telehealth: Payer: Self-pay

## 2014-12-23 MED ORDER — MESALAMINE 1.2 G PO TBEC: 2.4000 g | DELAYED_RELEASE_TABLET | Freq: Two times a day (BID) | ORAL | Status: DC

## 2014-12-23 NOTE — Telephone Encounter (Signed)
-----   Message from Zunairah Devers E [place], [place] sent at 12/18/2014  1:47 PM EST -----   ----- Message -----    From: Finlee Concepcion E [place], [place]: 12/18/2014      To: Finnigan Warriner [place], [place] sent in to pharmacy in Feb 2016, see phone note for 10/15/14

## 2014-12-23 NOTE — Telephone Encounter (Signed)
Spoke with patient and confirmed pharmacy to send in the [place] too.

## 2015-02-02 ENCOUNTER — Encounter: Payer: Self-pay | Admitting: Internal Medicine

## 2015-06-23 ENCOUNTER — Other Ambulatory Visit: Payer: Self-pay | Admitting: Internal Medicine

## 2015-08-19 ENCOUNTER — Ambulatory Visit (INDEPENDENT_AMBULATORY_CARE_PROVIDER_SITE_OTHER): Payer: BLUE CROSS/BLUE SHIELD | Admitting: Family

## 2015-08-19 ENCOUNTER — Encounter: Payer: Self-pay | Admitting: Family

## 2015-08-19 VITALS — BP 118/84 | HR 91 | Temp 98.3°F | Resp 16 | Ht 62.0 in | Wt 118.0 lb

## 2015-08-19 DIAGNOSIS — K5150 Left sided colitis without complications: Secondary | ICD-10-CM | POA: Diagnosis not present

## 2015-08-19 DIAGNOSIS — J302 Other seasonal allergic rhinitis: Secondary | ICD-10-CM | POA: Insufficient documentation

## 2015-08-19 MED ORDER — CYCLOBENZAPRINE HCL 5 MG PO TABS: 5.0000 mg | ORAL_TABLET | Freq: Three times a day (TID) | ORAL | Status: DC | PRN

## 2015-08-19 NOTE — Patient Instructions (Signed)
Apply heat as needed to neck/shoulder. You may use tylenol as needed for pain and flexeril as needed for muscle tightness. Please call if symptoms worsen or if symptoms do not improve. Schedule complete physical at the front desk at your convenience. Welcome to [facility]!

## 2015-08-19 NOTE — Assessment & Plan Note (Signed)
Clinically stable on lialda, management per GI.

## 2015-08-19 NOTE — Progress Notes (Signed)
Subjective:    Patient ID: Gloria Fox, female    DOB: [birth year], 39 y.o.   MRN: [id number]  HPI  Gloria Fox is a 39 yr old female who presents today to establish care.  She presents today following injury to her right shoulder. Reports that over the weekend an elevator door closed on her right shoulder. Since that time she reports some tightness in her shoulder.  Cannot take NSAIDS due to lialda.   Pmhx is significant for ulcerative colitis. She is maintained on lialda. She is follwed by Dr. Silvano Rusk for GI. Reports overall stable.  Seasonal allergies- using claritin daily and allergies are well controlled.  Anemia- in the past after her pregnancy.   Lab Results  Component Value Date   WBC 6.1 04/03/2014   HGB 14.0 04/03/2014   HCT 41.4 04/03/2014   MCV 89.5 04/03/2014   PLT 204.0 04/03/2014      Review of Systems  Constitutional: Negative for unexpected weight change.  HENT: Negative for hearing loss and rhinorrhea.   Eyes: Negative for visual disturbance.  Respiratory: Negative for cough.   Cardiovascular: Negative for leg swelling.  Gastrointestinal:       Occasional diarrhea  Genitourinary: Negative for dysuria and frequency.  Musculoskeletal: Positive for neck pain.  Skin: Negative for rash.  Neurological:       Occasional headaches  Hematological: Negative for adenopathy.  Psychiatric/Behavioral:       Denies depression/anxiety       Past Medical History  Diagnosis Date  . Allergy     SEASONAL  . Anemia   . Colitis 01/12/12    left colon  . Sessile rectal polyp 01/12/12  . History of bursitis     right shoulder    Social History   Social History  . Marital Status: Married    Spouse Name: N/A  . Number of Children: 2  . Years of Education: N/A   Occupational History  . Nurse    Social History Main Topics  . Smoking status: Never Smoker   . Smokeless tobacco: Never Used  . Alcohol Use: No  . Drug Use: No  . Sexual Activity: Not on  file   Other Topics Concern  . Not on file   Social History Narrative   Daily caffeine     Past Surgical History  Procedure Laterality Date  . Cystectomy    . Urethral cyst removal    . Colonoscopy  01/12/12    Family History  Problem Relation Age of Onset  . Colon cancer Neg Hx   . Hypertension Mother   . Hypertension Father   . Hypertension Sister   . Alcohol abuse Maternal Grandfather   . Alcohol abuse Paternal Grandmother     Allergies  Allergen Reactions  . Cephalexin Rash  . Sulfa Drugs Cross Reactors Rash    Current Outpatient Prescriptions on File Prior to Visit  Medication Sig Dispense Refill  . CANASA 1000 MG suppository INSERT 1 SUPPOSITORY RECTALLY AT BEDTIME 30 suppository 0  . mesalamine (LIALDA) 1.2 G EC tablet Take 2 tablets (2.4 g total) by mouth 2 (two) times daily. 360 tablet 3   No current facility-administered medications on file prior to visit.    BP 118/84 mmHg  Pulse 91  Temp(Src) 98.3 F (36.8 C) (Oral)  Resp 16  Ht 5' 2"  (1.575 m)  Wt 118 lb (53.524 kg)  BMI 21.58 kg/m2  SpO2 100%  LMP 08/11/2015    Objective:  Physical Exam  Constitutional: She is oriented to person, place, and time. She appears well-developed and well-nourished.  HENT:  Head: Normocephalic and atraumatic.  Right Ear: Tympanic membrane and ear canal normal.  Left Ear: Tympanic membrane and ear canal normal.  Mouth/Throat: No posterior oropharyngeal edema.  Eyes: Pupils are equal, round, and reactive to light. No scleral icterus.  Neck: No thyromegaly present.  Cardiovascular: Normal rate, regular rhythm and normal heart sounds.   No murmur heard. Pulmonary/Chest: Effort normal and breath sounds normal. No respiratory distress. She has no wheezes.  Musculoskeletal: She exhibits no edema.  Full ROM of the right shoulder.  Some tenderness to palpation at the base of the right neck.   Lymphadenopathy:    She has no cervical adenopathy.  Neurological: She is  alert and oriented to person, place, and time.  Psychiatric: She has a normal mood and affect. Her behavior is normal. Judgment and thought content normal.          Assessment & Plan:  R shoulder strain-  Advised, heat, flexeril prn (don't drive after taking due to drowsiness), follow up of symptoms worsen or do not improve.

## 2015-08-19 NOTE — Assessment & Plan Note (Signed)
Stable on claritin, continue same.

## 2015-08-19 NOTE — Progress Notes (Signed)
Pre visit review using our clinic review tool, if applicable. No additional management support is needed unless otherwise documented below in the visit note. 

## 2015-08-25 ENCOUNTER — Telehealth: Payer: Self-pay | Admitting: Family

## 2015-08-25 NOTE — Telephone Encounter (Signed)
pre visit letter mailed 08/25/15  °

## 2015-09-15 ENCOUNTER — Ambulatory Visit (INDEPENDENT_AMBULATORY_CARE_PROVIDER_SITE_OTHER): Payer: BLUE CROSS/BLUE SHIELD | Admitting: Family

## 2015-09-15 ENCOUNTER — Encounter: Payer: Self-pay | Admitting: Family

## 2015-09-15 VITALS — BP 120/80 | HR 84 | Temp 98.0°F | Resp 16 | Ht 62.0 in | Wt 122.2 lb

## 2015-09-15 DIAGNOSIS — Z0001 Encounter for general adult medical examination with abnormal findings: Secondary | ICD-10-CM | POA: Insufficient documentation

## 2015-09-15 DIAGNOSIS — Z0000 Encounter for general adult medical examination without abnormal findings: Secondary | ICD-10-CM | POA: Diagnosis not present

## 2015-09-15 LAB — URINALYSIS, ROUTINE W REFLEX MICROSCOPIC
BILIRUBIN URINE: NEGATIVE
Hgb urine dipstick: NEGATIVE
Ketones, ur: NEGATIVE
Leukocytes, UA: NEGATIVE
Nitrite: NEGATIVE
PH: 6 (ref 5.0–8.0)
RBC / HPF: NONE SEEN (ref 0–?)
SPECIFIC GRAVITY, URINE: 1.02 (ref 1.000–1.030)
Total Protein, Urine: NEGATIVE
UROBILINOGEN UA: 0.2 (ref 0.0–1.0)
Urine Glucose: NEGATIVE
WBC UA: NONE SEEN (ref 0–?)

## 2015-09-15 LAB — CBC WITH DIFFERENTIAL/PLATELET
BASOS PCT: 0.7 % (ref 0.0–3.0)
Basophils Absolute: 0 10*3/uL (ref 0.0–0.1)
Eosinophils Absolute: 0.2 10*3/uL (ref 0.0–0.7)
Eosinophils Relative: 4.7 % (ref 0.0–5.0)
HEMATOCRIT: 39.4 % (ref 36.0–46.0)
Hemoglobin: 13 g/dL (ref 12.0–15.0)
LYMPHS ABS: 1.4 10*3/uL (ref 0.7–4.0)
LYMPHS PCT: 31.6 % (ref 12.0–46.0)
MCHC: 33 g/dL (ref 30.0–36.0)
MCV: 89.4 fl (ref 78.0–100.0)
MONOS PCT: 10.4 % (ref 3.0–12.0)
Monocytes Absolute: 0.5 10*3/uL (ref 0.1–1.0)
NEUTROS ABS: 2.4 10*3/uL (ref 1.4–7.7)
NEUTROS PCT: 52.6 % (ref 43.0–77.0)
PLATELETS: 213 10*3/uL (ref 150.0–400.0)
RBC: 4.41 Mil/uL (ref 3.87–5.11)
RDW: 14.5 % (ref 11.5–15.5)
WBC: 4.5 10*3/uL (ref 4.0–10.5)

## 2015-09-15 LAB — LIPID PANEL
CHOL/HDL RATIO: 4
Cholesterol: 142 mg/dL (ref 0–200)
HDL: 39 mg/dL — AB (ref >39.00)
LDL CALC: 81 mg/dL (ref 0–99)
NONHDL: 103.45
TRIGLYCERIDES: 111 mg/dL (ref 0.0–149.0)
VLDL: 22.2 mg/dL (ref 0.0–40.0)

## 2015-09-15 LAB — HEPATIC FUNCTION PANEL
ALK PHOS: 67 U/L (ref 39–117)
ALT: 13 U/L (ref 0–35)
AST: 18 U/L (ref 0–37)
Albumin: 4 g/dL (ref 3.5–5.2)
BILIRUBIN DIRECT: 0.1 mg/dL (ref 0.0–0.3)
BILIRUBIN TOTAL: 0.5 mg/dL (ref 0.2–1.2)
TOTAL PROTEIN: 7 g/dL (ref 6.0–8.3)

## 2015-09-15 LAB — BASIC METABOLIC PANEL
BUN: 17 mg/dL (ref 6–23)
CHLORIDE: 105 meq/L (ref 96–112)
CO2: 27 meq/L (ref 19–32)
CREATININE: 0.75 mg/dL (ref 0.40–1.20)
Calcium: 9.1 mg/dL (ref 8.4–10.5)
GFR: 91.38 mL/min (ref >60.00)
Glucose, Bld: 90 mg/dL (ref 70–99)
Potassium: 3.9 mEq/L (ref 3.5–5.1)
SODIUM: 138 meq/L (ref 135–145)

## 2015-09-15 LAB — TSH: TSH: 1.69 u[IU]/mL (ref 0.35–4.50)

## 2015-09-15 NOTE — Progress Notes (Signed)
Pre visit review using our clinic review tool, if applicable. No additional management support is needed unless otherwise documented below in the visit note. 

## 2015-09-15 NOTE — Patient Instructions (Signed)
Please complete lab work prior to leaving. Continue healthy diet and exercise.

## 2015-09-15 NOTE — Progress Notes (Signed)
Subjective:    Patient ID: Gloria Fox, female    DOB: 1976-05-06, 39 y.o.   MRN: [id number]  HPI  Patient presents today for complete physical.  Immunizations: flu shot up to date, will obtain tetanus through work.  Diet: healthy Exercise:  some exercise, walking Pap Smear: >3 yrs ago- will complete with GYN ( Dr. Ronita Hipps)  Dental:  Has apt in November Vision- had exam last month   Review of Systems  Constitutional: Negative for unexpected weight change.  HENT: Negative for hearing loss and rhinorrhea.   Eyes: Negative for visual disturbance.  Respiratory: Negative for cough and shortness of breath.   Cardiovascular: Negative for chest pain.  Gastrointestinal: Negative for nausea, vomiting and diarrhea.  Genitourinary: Negative for dysuria, frequency and menstrual problem.  Musculoskeletal: Negative for myalgias and arthralgias.  Skin: Negative for rash.  Neurological:       Occasional headaches  Hematological: Negative for adenopathy.  Psychiatric/Behavioral:       Denies depression/anxiety   Past Medical History  Diagnosis Date  . Allergy     SEASONAL  . Anemia   . Colitis 01/12/12    left colon  . Sessile rectal polyp 01/12/12  . History of bursitis     right shoulder    Social History   Social History  . Marital Status: Married    Spouse Name: N/A  . Number of Children: 2  . Years of Education: N/A   Occupational History  . Nurse    Social History Main Topics  . Smoking status: Never Smoker   . Smokeless tobacco: Never Used  . Alcohol Use: No  . Drug Use: No  . Sexual Activity: Not on file   Other Topics Concern  . 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     Past Surgical History  Procedure Laterality Date  . Cystectomy      reports hx of urethral cyst  . Urethral cyst removal    . Colonoscopy  01/12/12    Family History  Problem  Relation Age of Onset  . Colon cancer Neg Hx   . Hypertension Mother   . Hypertension Father   . Hypertension Sister   . Alcohol abuse Maternal Grandfather   . Alcohol abuse Paternal Grandmother     Allergies  Allergen Reactions  . Cephalexin Rash  . Sulfa Drugs Cross Reactors Rash    Current Outpatient Prescriptions on File Prior to Visit  Medication Sig Dispense Refill  . CANASA 1000 MG suppository INSERT 1 SUPPOSITORY RECTALLY AT BEDTIME 30 suppository 0  . cyclobenzaprine (FLEXERIL) 5 MG tablet Take 1 tablet (5 mg total) by mouth 3 (three) times daily as needed for muscle spasms. 20 tablet 0  . loratadine (CLARITIN) 10 MG tablet Take 10 mg by mouth daily.    . mesalamine (LIALDA) 1.2 G EC tablet Take 2 tablets (2.4 g total) by mouth 2 (two) times daily. 360 tablet 3   No current facility-administered medications on file prior to visit.    BP 120/80 mmHg  Pulse 84  Temp(Src) 98 F (36.7 C) (Oral)  Resp 16  Ht 5' 2"  (1.575 m)  Wt 122 lb 3.2 oz (55.43 kg)  BMI 22.35 kg/m2  SpO2   LMP 08/05/2015       Objective:   Physical Exam  Physical Exam  Constitutional: She is  oriented to person, place, and time. She appears well-developed and well-nourished. No distress.  HENT:  Head: Normocephalic and atraumatic.  Right Ear: Tympanic membrane and ear canal normal.  Left Ear: Tympanic membrane and ear canal normal.  Mouth/Throat: Oropharynx is clear and moist.  Eyes: Pupils are equal, round, and reactive to light. No scleral icterus.  Neck: Normal range of motion. No thyromegaly present.  Cardiovascular: Normal rate and regular rhythm.   No murmur heard. Pulmonary/Chest: Effort normal and breath sounds normal. No respiratory distress. He has no wheezes. She has no rales. She exhibits no tenderness.  Abdominal: Soft. Bowel sounds are normal. He exhibits no distension and no mass. There is no tenderness. There is no rebound and no guarding.  Musculoskeletal: She exhibits no  edema.  Lymphadenopathy:    She has no cervical adenopathy.  Neurological: She is alert and oriented to person, place, and time. She has normal patellar reflexes. She exhibits normal muscle tone. Coordination normal.  Skin: Skin is warm and dry.  Psychiatric: She has a normal mood and affect. Her behavior is normal. Judgment and thought content normal.  Breasts: Examined lying Right: Without masses, retractions, discharge or axillary adenopathy.  Left: Without masses, retractions, discharge or axillary adenopathy.  Pelvic: deferred      Assessment & Plan:         Assessment & Plan:

## 2015-09-15 NOTE — Assessment & Plan Note (Signed)
Continue healthy diet, exercise, obtain routine lab work.  Pt will schedule pap with GYN, Tetanus with employer.

## 2015-09-28 ENCOUNTER — Other Ambulatory Visit: Payer: Self-pay | Admitting: Internal Medicine

## 2015-11-10 ENCOUNTER — Encounter: Payer: Self-pay | Admitting: Internal Medicine

## 2015-11-10 ENCOUNTER — Ambulatory Visit (INDEPENDENT_AMBULATORY_CARE_PROVIDER_SITE_OTHER): Payer: BLUE CROSS/BLUE SHIELD | Admitting: Internal Medicine

## 2015-11-10 VITALS — BP 100/60 | HR 80 | Ht 62.0 in | Wt 119.0 lb

## 2015-11-10 DIAGNOSIS — K51511 Left sided colitis with rectal bleeding: Secondary | ICD-10-CM | POA: Diagnosis not present

## 2015-11-10 MED ORDER — PREDNISONE 10 MG PO TABS: ORAL_TABLET | ORAL | Status: DC

## 2015-11-10 MED ORDER — HYOSCYAMINE SULFATE 0.125 MG SL SUBL: 0.1250 mg | SUBLINGUAL_TABLET | SUBLINGUAL | Status: DC | PRN

## 2015-11-10 MED ORDER — MESALAMINE 1000 MG RE SUPP: RECTAL | Status: DC

## 2015-11-10 NOTE — Progress Notes (Signed)
   Subjective:    Patient ID: Gloria Fox, female    DOB: May 12, 1976, 39 y.o.   MRN: [id number] Cc: UC flare HPI Gloria Fox is here for f/u - she had a rough weekend with some rectal bleeding and some cramping. She has off and on again rectal bleeding and feels like that her disease is controlled but not remission. There is bloating and some left lower quadrant pain at times as well. She used Canasa suppositories again over the weekend and the bleeding seems to have subsided. She associates some of her problems with stress, she was repairing her son's year book advertisement, as he is a regimen senior this year, and helping her too teenage son study for tests as well.  Otherwise no complaints. She had a good primary care visit about 2 months ago with normal CBC and metabolic panel.  Medications, allergies, past medical history, past surgical history, family history and social history are reviewed and updated in the EMR.  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 Prednisone taper over a month. She has tried budesonide before and that didn't really help. Canasa suppository refill and use nightly Colonoscopy late February or early March to assess disease status may need to consider other treatment Hyoscyamine 0.125 mg when necessary  15 minutes time spent with patient > half in counseling coordination of care

## 2015-11-10 NOTE — Assessment & Plan Note (Addendum)
She seems to be recovering some from a flare but it sounds like her disease is not as controlled as I thought it was. Prednisone taper over a month. She has tried budesonide before and that didn't really help. Canasa suppository refill and use nightly Colonoscopy late February or early March to assess disease status may need to consider other treatment Hyoscyamine 0.125 mg when necessary

## 2015-11-10 NOTE — Patient Instructions (Signed)
  We have sent the following medications to your pharmacy for you to pick up at your convenience: Prednisone , generic levsin   Take your prednisone as follows:  35m x 5 days, 346mx 5 days, 2033m 5 days, 68m51m7 days, 5mg 74m days then stop.    We have sent the following prescriptions to your mail in pharmacy: Generic Levsin, Canasa  If you have not heard from your mail in pharmacy within 1 week or if you have not received your medication in the mail, please contact us [phone] may find out why.    Call us [place] early February to book a pre-visit and colonoscopy appointment for late February early March.    I appreciate the opportunity to care for you. Carl Silvano Rusk FACGK [facility]

## 2016-01-04 ENCOUNTER — Other Ambulatory Visit: Payer: Self-pay | Admitting: Internal Medicine

## 2016-09-15 ENCOUNTER — Encounter: Payer: Self-pay | Admitting: Family

## 2016-09-15 ENCOUNTER — Other Ambulatory Visit (HOSPITAL_COMMUNITY)
Admission: RE | Admit: 2016-09-15 | Discharge: 2016-09-15 | Disposition: A | Discharge status: Home / Self Care | Payer: BLUE CROSS/BLUE SHIELD | Source: Ambulatory Visit | Attending: Family | Admitting: Family

## 2016-09-15 ENCOUNTER — Ambulatory Visit (INDEPENDENT_AMBULATORY_CARE_PROVIDER_SITE_OTHER): Payer: BLUE CROSS/BLUE SHIELD | Admitting: Family

## 2016-09-15 VITALS — BP 100/72 | HR 100 | Temp 97.8°F | Resp 16 | Ht 62.0 in | Wt 122.0 lb

## 2016-09-15 DIAGNOSIS — Z23 Encounter for immunization: Secondary | ICD-10-CM

## 2016-09-15 DIAGNOSIS — Z01419 Encounter for gynecological examination (general) (routine) without abnormal findings: Secondary | ICD-10-CM | POA: Insufficient documentation

## 2016-09-15 DIAGNOSIS — Z1151 Encounter for screening for human papillomavirus (HPV): Secondary | ICD-10-CM | POA: Insufficient documentation

## 2016-09-15 DIAGNOSIS — Z0000 Encounter for general adult medical examination without abnormal findings: Secondary | ICD-10-CM

## 2016-09-15 LAB — BASIC METABOLIC PANEL
BUN: 15 mg/dL (ref 6–23)
CALCIUM: 9.3 mg/dL (ref 8.4–10.5)
CO2: 26 mEq/L (ref 19–32)
CREATININE: 0.8 mg/dL (ref 0.40–1.20)
Chloride: 105 mEq/L (ref 96–112)
GFR: 84.39 mL/min (ref >60.00)
GLUCOSE: 86 mg/dL (ref 70–99)
Potassium: 3.8 mEq/L (ref 3.5–5.1)
SODIUM: 138 meq/L (ref 135–145)

## 2016-09-15 LAB — HEPATIC FUNCTION PANEL
ALK PHOS: 63 U/L (ref 39–117)
ALT: 11 U/L (ref 0–35)
AST: 17 U/L (ref 0–37)
Albumin: 4.3 g/dL (ref 3.5–5.2)
BILIRUBIN DIRECT: 0.1 mg/dL (ref 0.0–0.3)
BILIRUBIN TOTAL: 0.5 mg/dL (ref 0.2–1.2)
Total Protein: 7.4 g/dL (ref 6.0–8.3)

## 2016-09-15 LAB — URINALYSIS, ROUTINE W REFLEX MICROSCOPIC
Bilirubin Urine: NEGATIVE
KETONES UR: NEGATIVE
Leukocytes, UA: NEGATIVE
Nitrite: NEGATIVE
PH: 6 (ref 5.0–8.0)
Total Protein, Urine: NEGATIVE
UROBILINOGEN UA: 0.2 (ref 0.0–1.0)
Urine Glucose: NEGATIVE
WBC UA: NONE SEEN (ref 0–?)

## 2016-09-15 LAB — CBC WITH DIFFERENTIAL/PLATELET
BASOS ABS: 0 10*3/uL (ref 0.0–0.1)
Basophils Relative: 0.5 % (ref 0.0–3.0)
EOS ABS: 0.2 10*3/uL (ref 0.0–0.7)
Eosinophils Relative: 3 % (ref 0.0–5.0)
HCT: 38.5 % (ref 36.0–46.0)
Hemoglobin: 13 g/dL (ref 12.0–15.0)
LYMPHS ABS: 1.5 10*3/uL (ref 0.7–4.0)
Lymphocytes Relative: 26.2 % (ref 12.0–46.0)
MCHC: 33.7 g/dL (ref 30.0–36.0)
MCV: 83.5 fl (ref 78.0–100.0)
MONO ABS: 0.4 10*3/uL (ref 0.1–1.0)
MONOS PCT: 7.1 % (ref 3.0–12.0)
NEUTROS ABS: 3.7 10*3/uL (ref 1.4–7.7)
Neutrophils Relative %: 63.2 % (ref 43.0–77.0)
PLATELETS: 239 10*3/uL (ref 150.0–400.0)
RBC: 4.61 Mil/uL (ref 3.87–5.11)
RDW: 15.9 % — ABNORMAL HIGH (ref 11.5–15.5)
WBC: 5.8 10*3/uL (ref 4.0–10.5)

## 2016-09-15 LAB — LIPID PANEL
CHOL/HDL RATIO: 3
Cholesterol: 162 mg/dL (ref 0–200)
HDL: 48.4 mg/dL (ref >39.00)
LDL CALC: 94 mg/dL (ref 0–99)
NONHDL: 113.54
TRIGLYCERIDES: 97 mg/dL (ref 0.0–149.0)
VLDL: 19.4 mg/dL (ref 0.0–40.0)

## 2016-09-15 LAB — TSH: TSH: 1.77 u[IU]/mL (ref 0.35–4.50)

## 2016-09-15 NOTE — Addendum Note (Signed)
Addended by: Naaman Plummer A on: 09/15/2016 11:59 AM   Modules accepted: Orders

## 2016-09-15 NOTE — Progress Notes (Signed)
Pre visit review using our clinic review tool, if applicable. No additional management support is needed unless otherwise documented below in the visit note. 

## 2016-09-15 NOTE — Patient Instructions (Signed)
Please complete lab work prior to leaving.   

## 2016-09-15 NOTE — Progress Notes (Signed)
Subjective:    Patient ID: Gloria Fox, female    DOB: 1976/11/01, 40 y.o.   MRN: [id number]  HPI  Patient presents today for complete physical.  Immunizations: tetanus due. Had flu shot at work.  Diet: healthy Exercise:  Trying to do some yoga Pap Smear: unknown Mammogram: due     Review of Systems  Constitutional: Negative for unexpected weight change.  HENT: Negative for hearing loss and rhinorrhea.   Eyes: Negative for visual disturbance.  Respiratory: Negative for cough and shortness of breath.   Cardiovascular: Negative for chest pain and leg swelling.  Gastrointestinal: Negative for blood in stool and constipation.  Genitourinary: Negative for dysuria, frequency and hematuria.  Musculoskeletal: Negative for arthralgias and myalgias.  Skin: Negative for rash.  Neurological: Negative for headaches.  Hematological: Negative for adenopathy.  Psychiatric/Behavioral:       Denies depression/anxiety   Past Medical History:  Diagnosis Date  . Allergy    SEASONAL  . Anemia   . Colitis 01/12/12   left colon  . History of bursitis    right shoulder  . Sessile rectal polyp 01/12/12     Social History   Social History  . Marital status: Married    Spouse name: N/A  . Number of children: 2  . Years of education: N/A   Occupational History  . Nurse    Social History Main Topics  . Smoking status: Never Smoker  . Smokeless tobacco: Never Used  . Alcohol use No  . Drug use: No  . Sexual activity: Not on file   Other Topics Concern  . 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     Past Surgical History:  Procedure Laterality Date  . COLONOSCOPY  01/12/12  . CYSTECTOMY     reports hx of urethral cyst  . URETHRAL CYST REMOVAL      Family History  Problem Relation Age of Onset  . Hypertension Mother   . Hypertension Father   . Hypertension Sister   .  Alcohol abuse Maternal Grandfather   . Alcohol abuse Paternal Grandmother   . Colon cancer Neg Hx     Allergies  Allergen Reactions  . Cephalexin Rash  . Sulfa Drugs Cross Reactors Rash    Current Outpatient Prescriptions on File Prior to Visit  Medication Sig Dispense Refill  . hyoscyamine (LEVSIN SL) 0.125 MG SL tablet Place 1 tablet (0.125 mg total) under the tongue every 4 (four) hours as needed. 30 tablet 0  . LIALDA 1.2 g EC tablet TAKE 2 TABLETS (2.4 GRAMS TOTAL) TWICE A DAY 360 tablet 2  . loratadine (CLARITIN) 10 MG tablet Take 10 mg by mouth daily.    . mesalamine (CANASA) 1000 MG suppository Insert 1 suppository rectally at bedtime 90 suppository 3   No current facility-administered medications on file prior to visit.     BP 100/72   Pulse 100   Temp 97.8 F (36.6 C) (Oral)   Resp 16   Ht 5' 2"  (1.575 m)   Wt 122 lb (55.3 kg)   LMP 09/07/2016   SpO2 98% Comment: room air  BMI 22.31 kg/m        Objective:   Physical Exam  Physical Exam  Constitutional: She is oriented to person, place, and time. She appears well-developed and well-nourished. No distress.  HENT:  Head: Normocephalic  and atraumatic.  Right Ear: Tympanic membrane and ear canal normal.  Left Ear: Tympanic membrane and ear canal normal.  Mouth/Throat: Oropharynx is clear and moist.  Eyes: Pupils are equal, round, and reactive to light. No scleral icterus.  Neck: Normal range of motion. No thyromegaly present.  Cardiovascular: Normal rate and regular rhythm.   No murmur heard. Pulmonary/Chest: Effort normal and breath sounds normal. No respiratory distress. He has no wheezes. She has no rales. She exhibits no tenderness.  Abdominal: Soft. Bowel sounds are normal. She exhibits no distension and no mass. There is no tenderness. There is no rebound and no guarding.  Musculoskeletal: She exhibits no edema.  Lymphadenopathy:    She has no cervical adenopathy.  Neurological: She is alert and  oriented to person, place, and time. She has normal patellar reflexes. She exhibits normal muscle tone. Coordination normal.  Skin: Skin is warm and dry.  Psychiatric: She has a normal mood and affect. Her behavior is normal. Judgment and thought content normal.  Breasts: Examined lying Right: Without masses, retractions, discharge or axillary adenopathy.  Left: Without masses, retractions, discharge or axillary adenopathy.  Inguinal/mons: Normal without inguinal adenopathy  External genitalia: Normal  BUS/Urethra/Skene's glands: Normal  Bladder: Normal  Vagina: Normal  Cervix: Normal  Uterus: normal in size, shape and contour. Midline and mobile  Adnexa/parametria:  Rt: Without masses or tenderness.  Lt: Without masses or tenderness.  Anus and perineum: Normal           Assessment & Plan:   Preventative Care- continue healthy diet, exercise.  Obtain routine lab work.  Pap performed today.        Assessment & Plan:

## 2016-09-16 LAB — CYTOLOGY - PAP
DIAGNOSIS: NEGATIVE
HPV (WINDOPATH): NOT DETECTED

## 2016-10-08 ENCOUNTER — Other Ambulatory Visit: Payer: Self-pay | Admitting: Internal Medicine

## 2016-10-08 NOTE — Telephone Encounter (Signed)
Please advise how many RF's Sir, thank you.

## 2016-10-11 NOTE — Telephone Encounter (Signed)
OK to refill x 1 year

## 2016-12-16 ENCOUNTER — Encounter: Payer: Self-pay | Admitting: Internal Medicine

## 2016-12-16 ENCOUNTER — Ambulatory Visit (INDEPENDENT_AMBULATORY_CARE_PROVIDER_SITE_OTHER): Payer: BLUE CROSS/BLUE SHIELD | Admitting: Internal Medicine

## 2016-12-16 VITALS — BP 114/72 | HR 72 | Ht 62.0 in | Wt 121.5 lb

## 2016-12-16 DIAGNOSIS — M2550 Pain in unspecified joint: Secondary | ICD-10-CM

## 2016-12-16 DIAGNOSIS — K5150 Left sided colitis without complications: Secondary | ICD-10-CM

## 2016-12-16 NOTE — Patient Instructions (Signed)
   Follow up with Dr [name] in a year or sooner if needed.     I appreciate the opportunity to care for you. Silvano Rusk, MD, [facility]

## 2016-12-16 NOTE — Progress Notes (Signed)
   Gloria Fox 41 y.o. [birth year] [id number]  Assessment & Plan:   Encounter Diagnoses  Name Primary?  . Left sided ulcerative colitis without complication ([place])   . 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  Medications, allergies, past medical history, past surgical history, family history and social history are reviewed and updated in the EMR.  Review of Systems Aching joints, hips a little more   Objective:   Physical Exam @BP  114/72   Pulse 72   Ht 5' 2"  (1.575 m)   Wt 121 lb 8 oz (55.1 kg)   BMI 22.22 kg/m @  General:  NAD Eyes:   anicteric Lungs:  clear Heart::  S1S2 no rubs, murmurs or gallops Abdomen:  soft and nontender, BS+ Ext:  No signs of arthritis in hands   Data Reviewed:  10/17 labs - ok

## 2016-12-16 NOTE — Assessment & Plan Note (Signed)
Doing well 

## 2017-03-08 ENCOUNTER — Other Ambulatory Visit: Payer: Self-pay | Admitting: Internal Medicine

## 2017-09-19 ENCOUNTER — Encounter: Payer: Self-pay | Admitting: Family

## 2017-09-22 ENCOUNTER — Ambulatory Visit (INDEPENDENT_AMBULATORY_CARE_PROVIDER_SITE_OTHER): Payer: BLUE CROSS/BLUE SHIELD | Admitting: Family

## 2017-09-22 ENCOUNTER — Encounter: Payer: Self-pay | Admitting: Family

## 2017-09-22 VITALS — BP 110/70 | HR 94 | Temp 98.1°F | Resp 18 | Ht 62.0 in | Wt 123.0 lb

## 2017-09-22 DIAGNOSIS — M2550 Pain in unspecified joint: Secondary | ICD-10-CM | POA: Diagnosis not present

## 2017-09-22 DIAGNOSIS — Z0000 Encounter for general adult medical examination without abnormal findings: Secondary | ICD-10-CM | POA: Diagnosis not present

## 2017-09-22 LAB — URINALYSIS, ROUTINE W REFLEX MICROSCOPIC
Bilirubin Urine: NEGATIVE
KETONES UR: NEGATIVE
LEUKOCYTES UA: NEGATIVE
NITRITE: NEGATIVE
Total Protein, Urine: NEGATIVE
URINE GLUCOSE: NEGATIVE
Urobilinogen, UA: 0.2 (ref 0.0–1.0)
pH: 5.5 (ref 5.0–8.0)

## 2017-09-22 LAB — CBC WITH DIFFERENTIAL/PLATELET
BASOS ABS: 0 10*3/uL (ref 0.0–0.1)
Basophils Relative: 0.5 % (ref 0.0–3.0)
EOS ABS: 0.1 10*3/uL (ref 0.0–0.7)
Eosinophils Relative: 1.7 % (ref 0.0–5.0)
HCT: 41.6 % (ref 36.0–46.0)
Hemoglobin: 13.7 g/dL (ref 12.0–15.0)
LYMPHS ABS: 1.2 10*3/uL (ref 0.7–4.0)
Lymphocytes Relative: 22 % (ref 12.0–46.0)
MCHC: 33 g/dL (ref 30.0–36.0)
MCV: 91.2 fl (ref 78.0–100.0)
MONO ABS: 0.5 10*3/uL (ref 0.1–1.0)
Monocytes Relative: 9.3 % (ref 3.0–12.0)
NEUTROS ABS: 3.7 10*3/uL (ref 1.4–7.7)
NEUTROS PCT: 66.5 % (ref 43.0–77.0)
PLATELETS: 208 10*3/uL (ref 150.0–400.0)
RBC: 4.56 Mil/uL (ref 3.87–5.11)
RDW: 14.2 % (ref 11.5–15.5)
WBC: 5.5 10*3/uL (ref 4.0–10.5)

## 2017-09-22 LAB — BASIC METABOLIC PANEL
BUN: 18 mg/dL (ref 6–23)
CALCIUM: 9 mg/dL (ref 8.4–10.5)
CO2: 27 mEq/L (ref 19–32)
CREATININE: 0.77 mg/dL (ref 0.40–1.20)
Chloride: 106 mEq/L (ref 96–112)
GFR: 87.74 mL/min (ref >60.00)
GLUCOSE: 91 mg/dL (ref 70–99)
Potassium: 3.7 mEq/L (ref 3.5–5.1)
SODIUM: 138 meq/L (ref 135–145)

## 2017-09-22 LAB — LIPID PANEL
Cholesterol: 140 mg/dL (ref 0–200)
HDL: 39.7 mg/dL (ref >39.00)
LDL CALC: 86 mg/dL (ref 0–99)
NONHDL: 100.49
Total CHOL/HDL Ratio: 4
Triglycerides: 73 mg/dL (ref 0.0–149.0)
VLDL: 14.6 mg/dL (ref 0.0–40.0)

## 2017-09-22 LAB — HEPATIC FUNCTION PANEL
ALK PHOS: 58 U/L (ref 39–117)
ALT: 10 U/L (ref 0–35)
AST: 15 U/L (ref 0–37)
Albumin: 4.1 g/dL (ref 3.5–5.2)
BILIRUBIN TOTAL: 0.6 mg/dL (ref 0.2–1.2)
Bilirubin, Direct: 0.1 mg/dL (ref 0.0–0.3)
Total Protein: 7 g/dL (ref 6.0–8.3)

## 2017-09-22 LAB — TSH: TSH: 1.7 u[IU]/mL (ref 0.35–4.50)

## 2017-09-22 NOTE — Progress Notes (Signed)
Subjective:    Patient ID: Gloria Fox, female    DOB: 1976-04-22, 41 y.o.   MRN: [id number]  HPI   Patient presents today for complete physical.  Immunizations: tetanus 2017, flu shot up to date  Diet:  healthy Exercise:  Some exercise Pap Smear: 10/17 Mammogram: due Vision: 6/18 Dental:  8/18 Wt Readings from Last 3 Encounters:  09/22/17 123 lb (55.8 kg)  12/16/16 121 lb 8 oz (55.1 kg)  09/15/16 122 lb (55.3 kg)        Review of Systems  Constitutional: Negative for unexpected weight change.  HENT: Negative for hearing loss and rhinorrhea.   Eyes: Negative for visual disturbance.  Respiratory: Negative for cough and shortness of breath.   Cardiovascular: Negative for chest pain and leg swelling.  Gastrointestinal: Negative for constipation and diarrhea.  Genitourinary: Negative for dysuria, frequency, hematuria and menstrual problem.  Musculoskeletal: Negative for myalgias.       Occasional hand pain in the cold  Skin: Negative for rash.  Neurological:       Occasional headaches  Hematological: Negative for adenopathy.  Psychiatric/Behavioral:       Denies depression/anxiety       Past Medical History:  Diagnosis Date  . Allergy    SEASONAL  . Anemia   . Colitis 01/12/12   left colon  . History of bursitis    right shoulder  . Sessile rectal polyp 01/12/12     Social History   Social History  . Marital status: Married    Spouse name: N/A  . Number of children: 2  . Years of education: N/A   Occupational History  . Nurse    Social History Main Topics  . Smoking status: Never Smoker  . Smokeless tobacco: Never Used  . Alcohol use No  . Drug use: No  . Sexual activity: Not on file   Other Topics Concern  . 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     Past Surgical History:  Procedure Laterality Date  . COLONOSCOPY  01/12/12  . CYSTECTOMY      reports hx of urethral cyst  . URETHRAL CYST REMOVAL      Family History  Problem Relation Age of Onset  . Hypertension Mother   . Hypertension Father   . Hypertension Sister   . Alcohol abuse Maternal Grandfather   . Alcohol abuse Paternal Grandmother   . Colon cancer Neg Hx     Allergies  Allergen Reactions  . Cephalexin Rash  . Sulfa Drugs Cross Reactors Rash    Current Outpatient Prescriptions on File Prior to Visit  Medication Sig Dispense Refill  . CANASA 1000 MG suppository INSERT 1 SUPPOSITORY RECTALLY AT BEDTIME 90 suppository 3  . hyoscyamine (LEVSIN SL) 0.125 MG SL tablet Place 1 tablet (0.125 mg total) under the tongue every 4 (four) hours as needed. 30 tablet 0  . loratadine (CLARITIN) 10 MG tablet Take 10 mg by mouth daily.    . mesalamine (LIALDA) 1.2 g EC tablet TAKE 2 TABLETS (2.4 GRAMS TOTAL) TWICE A DAY 360 tablet 3   No current facility-administered medications on file prior to visit.     BP 110/70 (BP Location: Left Arm, Patient Position: Sitting, Cuff Size: Normal)   Pulse 94   Temp 98.1 F (36.7 C) (Oral)   Resp 18   Ht 5'  2" (1.575 m)   Wt 123 lb (55.8 kg)   SpO2 98%   BMI 22.50 kg/m    Objective:   Physical Exam Physical Exam  Constitutional: She is oriented to person, place, and time. She appears well-developed and well-nourished. No distress.  HENT:  Head: Normocephalic and atraumatic.  Right Ear: Tympanic membrane and ear canal normal.  Left Ear: Tympanic membrane and ear canal normal.  Mouth/Throat: Oropharynx is clear and moist.  Eyes: Pupils are equal, round, and reactive to light. No scleral icterus.  Neck: Normal range of motion. No thyromegaly present.  Cardiovascular: Normal rate and regular rhythm.   No murmur heard. Pulmonary/Chest: Effort normal and breath sounds normal. No respiratory distress. He has no wheezes. She has no rales. She exhibits no tenderness.  Abdominal: Soft. Bowel sounds are normal. She exhibits no  distension and no mass. There is no tenderness. There is no rebound and no guarding.  Musculoskeletal: She exhibits no edema.  Lymphadenopathy:    She has no cervical adenopathy.  Neurological: She is alert and oriented to person, place, and time. She has normal patellar reflexes. She exhibits normal muscle tone. Coordination normal.  Skin: Skin is warm and dry.  Psychiatric: She has a normal mood and affect. Her behavior is normal. Judgment and thought content normal.  Breasts: Examined lying Right: Without masses, retractions, discharge or axillary adenopathy.  Left: Without masses, retractions, discharge or axillary adenopathy.  Pelvic: deferred           Assessment & Plan:          Assessment & Plan:  Preventative care- EKG tracing is personally reviewed.  EKG notes NSR.  No acute changes.  Obtain routine lab work. Refer for mammogram  Hand pain- will check rheumatoid factor to rule out RA.  Suspect OA.

## 2017-09-22 NOTE — Patient Instructions (Signed)
Continue healthy diet. Try to add 30 minutes of cardio 5 days a week.   Complete lab work prior to leaving. Schedule mammogram on the first floor in imaging.

## 2017-09-23 ENCOUNTER — Encounter: Payer: Self-pay | Admitting: Family

## 2017-09-23 ENCOUNTER — Other Ambulatory Visit: Payer: Self-pay | Admitting: Internal Medicine

## 2017-09-23 ENCOUNTER — Other Ambulatory Visit: Payer: Self-pay | Admitting: Family

## 2017-09-23 DIAGNOSIS — R319 Hematuria, unspecified: Principal | ICD-10-CM

## 2017-09-23 DIAGNOSIS — N390 Urinary tract infection, site not specified: Secondary | ICD-10-CM

## 2017-09-23 DIAGNOSIS — R8290 Unspecified abnormal findings in urine: Secondary | ICD-10-CM

## 2017-09-23 LAB — RHEUMATOID FACTOR

## 2017-09-23 MED ORDER — NITROFURANTOIN MONOHYD MACRO 100 MG PO CAPS: 100.0000 mg | ORAL_CAPSULE | Freq: Two times a day (BID) | ORAL | 0 refills | Status: DC

## 2017-09-23 NOTE — Telephone Encounter (Signed)
Please let pt know that there are some cells from her kidneys in her urine. This could indicate urinary tract infection. I would like to treat her with macrobid and have her return in 2 weeks for a UA with micro. Other blood work looks good.

## 2017-09-23 NOTE — Telephone Encounter (Signed)
Notified pt. Sent rx to [facility] and scheduled lab appt for 10/07/17 at 8:15am. Future order entered.

## 2017-09-28 ENCOUNTER — Ambulatory Visit (HOSPITAL_BASED_OUTPATIENT_CLINIC_OR_DEPARTMENT_OTHER)
Admission: RE | Admit: 2017-09-28 | Discharge: 2017-09-28 | Disposition: A | Discharge status: Home / Self Care | Payer: BLUE CROSS/BLUE SHIELD | Source: Ambulatory Visit | Attending: Family | Admitting: Family

## 2017-09-28 DIAGNOSIS — Z0000 Encounter for general adult medical examination without abnormal findings: Secondary | ICD-10-CM

## 2017-09-28 DIAGNOSIS — Z1231 Encounter for screening mammogram for malignant neoplasm of breast: Secondary | ICD-10-CM | POA: Insufficient documentation

## 2017-09-28 NOTE — Telephone Encounter (Signed)
Melissa-- please advise above message.

## 2017-09-29 ENCOUNTER — Encounter: Payer: Self-pay | Admitting: Family

## 2017-09-30 ENCOUNTER — Other Ambulatory Visit: Payer: Self-pay | Admitting: Family

## 2017-09-30 DIAGNOSIS — R928 Other abnormal and inconclusive findings on diagnostic imaging of breast: Secondary | ICD-10-CM

## 2017-10-07 ENCOUNTER — Ambulatory Visit
Admission: RE | Admit: 2017-10-07 | Discharge: 2017-10-07 | Disposition: A | Discharge status: Home / Self Care | Payer: BLUE CROSS/BLUE SHIELD | Source: Ambulatory Visit | Attending: Family | Admitting: Family

## 2017-10-07 ENCOUNTER — Other Ambulatory Visit: Payer: Self-pay

## 2017-10-07 ENCOUNTER — Other Ambulatory Visit: Payer: Self-pay | Admitting: Family

## 2017-10-07 DIAGNOSIS — N6320 Unspecified lump in the left breast, unspecified quadrant: Secondary | ICD-10-CM | POA: Diagnosis not present

## 2017-10-07 DIAGNOSIS — R928 Other abnormal and inconclusive findings on diagnostic imaging of breast: Secondary | ICD-10-CM

## 2017-10-07 DIAGNOSIS — R922 Inconclusive mammogram: Secondary | ICD-10-CM | POA: Diagnosis not present

## 2017-10-07 DIAGNOSIS — N63 Unspecified lump in unspecified breast: Secondary | ICD-10-CM

## 2018-01-04 ENCOUNTER — Other Ambulatory Visit: Payer: Self-pay | Admitting: Internal Medicine

## 2018-02-08 ENCOUNTER — Encounter: Payer: Self-pay | Admitting: Internal Medicine

## 2018-02-08 ENCOUNTER — Ambulatory Visit (INDEPENDENT_AMBULATORY_CARE_PROVIDER_SITE_OTHER): Payer: BLUE CROSS/BLUE SHIELD | Admitting: Internal Medicine

## 2018-02-08 DIAGNOSIS — K51511 Left sided colitis with rectal bleeding: Secondary | ICD-10-CM | POA: Diagnosis not present

## 2018-02-08 NOTE — Assessment & Plan Note (Signed)
Recent flare  Assess w/ colonoscopy  Stay on current Tx Lialda and Canasa  The risks and benefits as well as alternatives of endoscopic procedure(s) have been discussed and reviewed. All questions answered. The patient agrees to proceed.

## 2018-02-08 NOTE — Progress Notes (Signed)
   Gloria Fox 41 y.o. [birth year] [id number]  Assessment & Plan:   Left sided ulcerative colitis Recent flare  Assess w/ colonoscopy  Stay on current Tx Lialda and Canasa  The risks and benefits as well as alternatives of endoscopic procedure(s) have been discussed and reviewed. All questions answered. The patient agrees to proceed.      Subjective:   Chief Complaint: Follow-up ulcerative colitis with recent flare  HPI The patient is here by herself for follow-up, last seen approximately 1 year ago.  She had a good year overall but in the past 3 weeks she started having some diarrhea and some rectal bleeding and some mild discomfort in the rectal area, she added Canasa 1000 mg suppository back to her regimen on a daily basis and has noted significant improvement.  It started around some stress.  She does report that subsequently to that she is improving, she did receive bad news that her mother has had a neck cancer, involving the oral cavity it sounds like, just the other day but that did not make her slide back at all.  She was having arthralgia issues last year those do not seem to be so much of a problem.  Work seems to be going okay and family life seems to be okay otherwise it sounds like.  She was otherwise on her Lialda 4.8 g daily.  Saw primary care in the fall with good checkup and labs EMR reviewed. Allergies  Allergen Reactions  . Cephalexin Rash  . Sulfa Drugs Cross Reactors Rash   Current Meds  Medication Sig  . CANASA 1000 MG suppository INSERT 1 SUPPOSITORY RECTALLY AT BEDTIME  . hyoscyamine (LEVSIN SL) 0.125 MG SL tablet DISSOLVE 1 TABLET UNDER THE SKIN EVERY 4 HOURS AS NEEDED  . LIALDA 1.2 g EC tablet TAKE 2 TABLETS (2.4 GRAMS TOTAL) TWICE A DAY  . loratadine (CLARITIN) 10 MG tablet Take 10 mg by mouth daily.   Past Medical History:  Diagnosis Date  . Allergy    SEASONAL  . Anemia   . Colitis 01/12/12   left colon  . History of bursitis    right  shoulder  . Sessile rectal polyp 01/12/12   Past Surgical History:  Procedure Laterality Date  . COLONOSCOPY  01/12/12  . CYSTECTOMY     reports hx of urethral cyst  . 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    family history includes Alcohol abuse in her maternal grandfather and paternal grandmother; Hypertension in her father, mother, and sister.   Review of Systems As above  Objective:   Physical Exam @BP  100/76   Pulse 88   Ht 5' 2"  (1.575 m)   Wt 123 lb 12.8 oz (56.2 kg)   LMP 01/23/2018 (Approximate)   BMI 22.64 kg/m @  General:  NAD Eyes:   anicteric Lungs:  clear Heart::  S1S2 no rubs, murmurs or gallops Abdomen:  soft and nontender, BS+ Ext:   no edema, cyanosis or clubbing    Data Reviewed:   See HPI

## 2018-02-08 NOTE — Patient Instructions (Signed)
You have been scheduled for a colonoscopy. Please follow written instructions given to you at your visit today.  Please pick up your prep supplies at the pharmacy. If you use inhalers (even only as needed), please bring them with you on the day of your procedure.   I appreciate the opportunity to care for you. Silvano Rusk, MD, [facility]

## 2018-04-04 ENCOUNTER — Encounter: Payer: Self-pay | Admitting: Internal Medicine

## 2018-04-04 ENCOUNTER — Ambulatory Visit (AMBULATORY_SURGERY_CENTER): Payer: BLUE CROSS/BLUE SHIELD | Admitting: Internal Medicine

## 2018-04-04 ENCOUNTER — Other Ambulatory Visit: Payer: Self-pay

## 2018-04-04 VITALS — BP 117/87 | HR 99 | Temp 98.9°F | Resp 13 | Ht 62.0 in | Wt 123.0 lb

## 2018-04-04 DIAGNOSIS — K51511 Left sided colitis with rectal bleeding: Secondary | ICD-10-CM

## 2018-04-04 DIAGNOSIS — K5190 Ulcerative colitis, unspecified, without complications: Secondary | ICD-10-CM | POA: Diagnosis not present

## 2018-04-04 MED ORDER — SODIUM CHLORIDE 0.9 % IV SOLN: 500.0000 mL | Freq: Once | INTRAVENOUS | Status: DC

## 2018-04-04 NOTE — Patient Instructions (Addendum)
   There is active inflammation in the rectum and sigmoid colon.  I think changing medications is appropriate. Once biopsies return will discuss.  Options I would consider are: [facility].  I appreciate the opportunity to care for you. Gatha Mayer, MD, FACG  YOU HAD AN ENDOSCOPIC PROCEDURE TODAY AT [facility]:   Refer to the procedure report that was given to you for any specific questions about what was found during the examination.  If the procedure report does not answer your questions, please call your gastroenterologist to clarify.  If you requested that your care partner not be given the details of your procedure findings, then the procedure report has been included in a sealed envelope for you to review at your convenience later.  YOU SHOULD EXPECT: Some feelings of bloating in the abdomen. Passage of more gas than usual.  Walking can help get rid of the air that was put into your GI tract during the procedure and reduce the bloating. If you had a lower endoscopy (such as a colonoscopy or flexible sigmoidoscopy) you may notice spotting of blood in your stool or on the toilet paper. If you underwent a bowel prep for your procedure, you may not have a normal bowel movement for a few days.  Please Note:  You might notice some irritation and congestion in your nose or some drainage.  This is from the oxygen used during your procedure.  There is no need for concern and it should clear up in a day or so.  SYMPTOMS TO REPORT IMMEDIATELY:   Following lower endoscopy (colonoscopy or flexible sigmoidoscopy):  Excessive amounts of blood in the stool  Significant tenderness or worsening of abdominal pains  Swelling of the abdomen that is new, acute  Fever of 100F or higher  For urgent or emergent issues, a gastroenterologist can be reached at any hour by calling [phone].   DIET:  We do recommend a small meal at first, but then you may proceed to  your regular diet.  Drink plenty of fluids but you should avoid alcoholic beverages for 24 hours.  ACTIVITY:  You should plan to take it easy for the rest of today and you should NOT DRIVE or use heavy machinery until tomorrow (because of the sedation medicines used during the test).    FOLLOW UP: Our staff will call the number listed on your records the next business day following your procedure to check on you and address any questions or concerns that you may have regarding the information given to you following your procedure. If we do not reach you, we will leave a message.  However, if you are feeling well and you are not experiencing any problems, there is no need to return our call.  We will assume that you have returned to your regular daily activities without incident.  If any biopsies were taken you will be contacted by phone or by letter within the next 1-3 weeks.  Please call us at [phone] if you have not heard about the biopsies in 3 weeks.    SIGNATURES/CONFIDENTIALITY: You and/or your care partner have signed paperwork which will be entered into your electronic medical record.  These signatures attest to the fact that that the information above on your After Visit Summary has been reviewed and is understood.  Full responsibility of the confidentiality of this discharge information lies with you and/or your care-partner.

## 2018-04-04 NOTE — Op Note (Signed)
[facility] Patient Name: Gloria Fox Procedure Date: 04/04/2018 1:56 PM MRN: [id number] Endoscopist: Gatha Mayer , MD Age: 42 Referring MD:  Date of Birth: 1976-09-27 Gender: Female Account #: [id number] Procedure:                Colonoscopy Indications:              Left-sided chronic ulcerative colitis, Follow-up of                            left-sided chronic ulcerative colitis, Disease                            activity assessment of left-sided chronic                            ulcerative colitis, Assess therapeutic response to                            therapy of chronic ulcerative pancolitis Medicines:                Propofol per Anesthesia, Monitored Anesthesia Care Procedure:                Pre-Anesthesia Assessment:                           - Prior to the procedure, a History and Physical                            was performed, and patient medications and                            allergies were reviewed. The patient's tolerance of                            previous anesthesia was also reviewed. The risks                            and benefits of the procedure and the sedation                            options and risks were discussed with the patient.                            All questions were answered, and informed consent                            was obtained. Prior Anticoagulants: The patient has                            taken no previous anticoagulant or antiplatelet                            agents. ASA Grade Assessment: II - A patient with  mild systemic disease. After reviewing the risks                            and benefits, the patient was deemed in                            satisfactory condition to undergo the procedure.                           After obtaining informed consent, the colonoscope                            was passed under direct vision. Throughout the   procedure, the patient's blood pressure, pulse, and                            oxygen saturations were monitored continuously. The                            Model PCF-[id number]) scope was introduced                            through the anus and advanced to the the terminal                            ileum, with identification of the appendiceal                            orifice and IC valve. The colonoscopy was somewhat                            difficult due to significant looping. Successful                            completion of the procedure was aided by applying                            abdominal pressure. The patient tolerated the                            procedure well. The quality of the bowel                            preparation was adequate. The bowel preparation                            used was Miralax. The terminal ileum, the ileocecal                            valve and the rectum were photographed. Scope In: 2:06:09 PM Scope Out: 2:21:10 PM Scope Withdrawal Time: 0 hours 11 minutes 9 seconds  Total Procedure Duration: 0 hours 15 minutes 1 second  Findings:  The perianal and digital rectal examinations were                            normal.                           Inflammation characterized by congestion (edema),                            erosions, erythema, friability, granularity, loss                            of vascularity and mucus was found in a continuous                            and circumferential pattern from the rectum to the                            sigmoid colon. This was mild in severity, and when                            compared to previous examinations, the findings are                            improved. Biopsies were taken with a cold forceps                            for histology. Verification of patient                            identification for the specimen was done. Estimated                             blood loss was minimal.                           The terminal ileum appeared normal.                           The exam was otherwise without abnormality on                            direct and retroflexion views.                           Biopsies for histology were taken with a cold                            forceps from the cecum, ascending colon and                            transverse colon for evaluation of microscopic                            colitis. Complications:  No immediate complications. Estimated Blood Loss:     Estimated blood loss was minimal. Impression:               - Left-sided colitis. Inflammation was found from                            the rectum to the sigmoid colon. This was mild in                            severity, improved compared to previous                            examinations. Biopsied.                           - The examined portion of the ileum was normal.                           - The examination was otherwise normal on direct                            and retroflexion views.                           - Biopsies were taken with a cold forceps from the                            cecum, ascending colon and transverse colon for                            evaluation of microscopic colitis. Recommendation:           - Patient has a contact number available for                            emergencies. The signs and symptoms of potential                            delayed complications were discussed with the                            patient. Return to normal activities tomorrow.                            Written discharge instructions were provided to the                            patient.                           - Resume previous diet.                           - Continue present medications.                           -  Await pathology results.                           - Think changing Tx makes sense - on 4.8 g Lialda                             and Canasa suppository Gatha Mayer, MD 04/04/2018 2:28:05 PM This report has been signed electronically.

## 2018-04-04 NOTE — Progress Notes (Signed)
Called to room to assist during endoscopic procedure.  Patient ID and intended procedure confirmed with present staff. Received instructions for my participation in the procedure from the performing physician.  

## 2018-04-04 NOTE — Progress Notes (Signed)
Report to PACU, RN, vss, BBS= Clear.  

## 2018-04-05 ENCOUNTER — Telehealth: Payer: Self-pay

## 2018-04-05 NOTE — Telephone Encounter (Signed)
  Follow up Call-  Call back number 04/04/2018  Post procedure Call Back phone  # [phone]  Permission to leave phone message Yes  Some recent data might be hidden     Patient questions:  Do you have a fever, pain , or abdominal swelling? No. Pain Score  0 *  Have you tolerated food without any problems? Yes.    Have you been able to return to your normal activities? Yes.    Do you have any questions about your discharge instructions: Diet   No. Medications  No. Follow up visit  No.  Do you have questions or concerns about your Care? No.  Actions: * If pain score is 4 or above: No action needed, pain <4.

## 2018-04-07 ENCOUNTER — Ambulatory Visit
Admission: RE | Admit: 2018-04-07 | Discharge: 2018-04-07 | Disposition: A | Discharge status: Home / Self Care | Payer: BLUE CROSS/BLUE SHIELD | Source: Ambulatory Visit | Attending: Family | Admitting: Family

## 2018-04-07 ENCOUNTER — Other Ambulatory Visit: Payer: Self-pay | Admitting: Family

## 2018-04-07 DIAGNOSIS — N6489 Other specified disorders of breast: Secondary | ICD-10-CM | POA: Diagnosis not present

## 2018-04-07 DIAGNOSIS — N6320 Unspecified lump in the left breast, unspecified quadrant: Secondary | ICD-10-CM

## 2018-04-07 DIAGNOSIS — N63 Unspecified lump in unspecified breast: Secondary | ICD-10-CM

## 2018-04-14 NOTE — Progress Notes (Signed)
Left bxs show colitis No recall at this time My Chart communication to patient

## 2018-04-20 ENCOUNTER — Encounter: Payer: Self-pay | Admitting: Internal Medicine

## 2018-04-21 ENCOUNTER — Other Ambulatory Visit: Payer: Self-pay | Admitting: Internal Medicine

## 2018-04-25 ENCOUNTER — Encounter: Payer: Self-pay | Admitting: Internal Medicine

## 2018-04-26 ENCOUNTER — Other Ambulatory Visit: Payer: Self-pay | Admitting: Internal Medicine

## 2018-04-26 MED ORDER — PREDNISONE 10 MG PO TABS: 40.0000 mg | ORAL_TABLET | Freq: Every day | ORAL | 0 refills | Status: DC

## 2018-06-25 ENCOUNTER — Other Ambulatory Visit: Payer: Self-pay | Admitting: Internal Medicine

## 2018-07-03 ENCOUNTER — Encounter: Payer: Self-pay | Admitting: Internal Medicine

## 2018-07-03 ENCOUNTER — Ambulatory Visit (INDEPENDENT_AMBULATORY_CARE_PROVIDER_SITE_OTHER): Payer: BLUE CROSS/BLUE SHIELD | Admitting: Internal Medicine

## 2018-07-03 VITALS — BP 110/68 | HR 72 | Ht 62.0 in | Wt 127.0 lb

## 2018-07-03 DIAGNOSIS — K5150 Left sided colitis without complications: Secondary | ICD-10-CM | POA: Diagnosis not present

## 2018-07-03 DIAGNOSIS — E739 Lactose intolerance, unspecified: Secondary | ICD-10-CM

## 2018-07-03 HISTORY — DX: Lactose intolerance, unspecified: E73.9

## 2018-07-03 NOTE — Assessment & Plan Note (Signed)
Hx suggests - she avoids/limits milk and ice cream - cheese is ok Handout given and explained not from UC as small intestine process

## 2018-07-03 NOTE — Assessment & Plan Note (Addendum)
Well now Continue current Rx w/ Lialda 4.8 g/day Hold off on biologics she does not want tos start RTC 6 mos Sooner prn She understands that most experts say should treat the disease to control inflammation - we have decided to observe clinically - if flares again will reconsider biologics Consider flex sig next year sooner prn Ideally I would use Entyvio since gut specific - would depend upon coverage and her preference

## 2018-07-03 NOTE — Patient Instructions (Signed)
  Please follow up with Dr [name] in 6 months or sooner if needed.    Today we are giving you a handout to read on lactose intolerance.    I appreciate the opportunity to care for you. Silvano Rusk, MD, [facility]

## 2018-07-03 NOTE — Progress Notes (Signed)
   Gloria Fox 42 y.o. 1976-07-01 [id number]  Assessment & Plan:  Left sided ulcerative colitis ([place]) Well now Continue current Rx w/ Lialda 4.8 g/day Hold off on biologics she does not want tos start RTC 6 mos Sooner prn She understands that most experts say should treat the disease to control inflammation - we have decided to observe clinically - if flares again will reconsider biologics Consider flex sig next year sooner prn Ideally I would use Entyvio since gut specific - would depend upon coverage and her preference  Lactose intolerance Hx suggests - she avoids/limits milk and ice cream - cheese is ok Handout given and explained not from UC as small intestine process   I appreciate the opportunity to care for her. Cc:O'Sullivan, Melissa, NP  Subjective:   Chief Complaint: Left UC - discuss Tx  HPI Gloria Fox is here and doing well now - 1 formed stool/day Pain in abdomen if she drinks milk - eats ice cream No bleeding She does not want to start a biologic now - has read about them Mom is through chemo, XRT and gamma knife and getting ready for surgery for sinus cancer  Allergies  Allergen Reactions  . Cephalexin Rash  . Sulfa Drugs Cross Reactors Rash   Current Meds  Medication Sig  . CANASA 1000 MG suppository INSERT 1 SUPPOSITORY RECTALLY AT BEDTIME (Patient taking differently: as needed. Insert 1 suppository rectally at bedtime)  . hyoscyamine (LEVSIN SL) 0.125 MG SL tablet DISSOLVE 1 TABLET UNDER THE SKIN EVERY 4 HOURS AS NEEDED  . loratadine (CLARITIN) 10 MG tablet Take 10 mg by mouth daily.  . mesalamine (LIALDA) 1.2 g EC tablet Take 2 tablets (2.4 g total) by mouth 2 (two) times daily. Please keep 07/03/18 appointment.   Past Medical History:  Diagnosis Date  . Allergy    SEASONAL  . Anemia   . Colitis 01/12/12   left colon  . History of bursitis    right shoulder  . Sessile rectal polyp 01/12/12   Past Surgical History:  Procedure Laterality Date    . COLONOSCOPY  01/12/12  . CYSTECTOMY     reports hx of urethral cyst  . 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    family history includes Alcohol abuse in her maternal grandfather and paternal grandmother; Hypertension in her father, mother, and sister.   Review of Systems As above  Objective:   Physical Exam BP 110/68   Pulse 72   Ht 5' 2"  (1.575 m)   Wt 127 lb (57.6 kg)   BMI 23.23 kg/m  NAD Looks well  15 minutes time spent with patient > half in counseling coordination of care

## 2018-08-09 IMAGING — MG 2D DIGITAL SCREENING BILATERAL MAMMOGRAM WITH CAD AND ADJUNCT TO
9 series · 9 of 25 positions shown · non-contrast
Comparison: None available

CLINICAL DATA: Screening.

EXAM:
2D DIGITAL SCREENING BILATERAL MAMMOGRAM WITH CAD AND ADJUNCT TOMO

[R CC (1 of 2)]
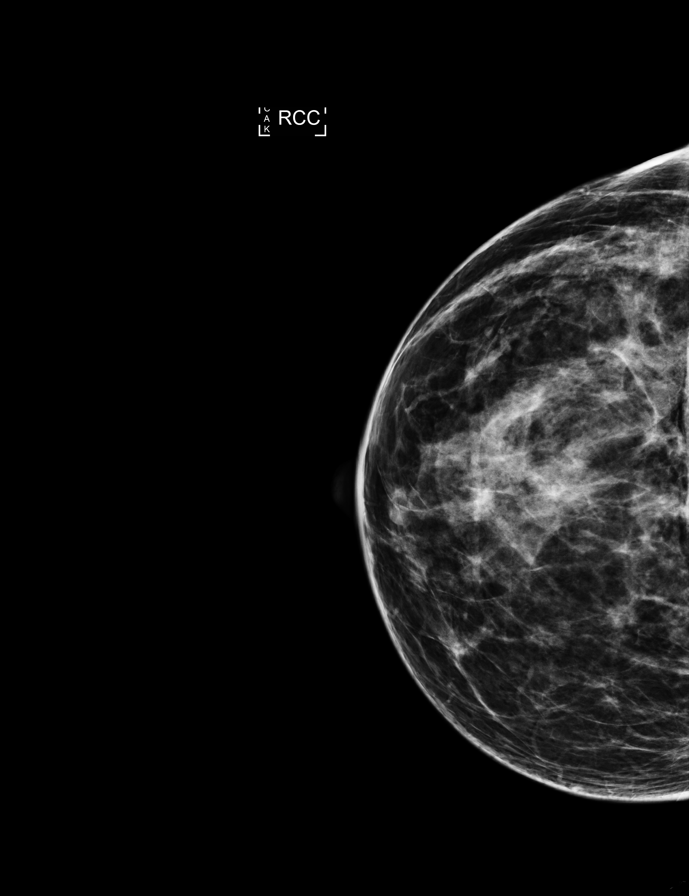

[L CC]
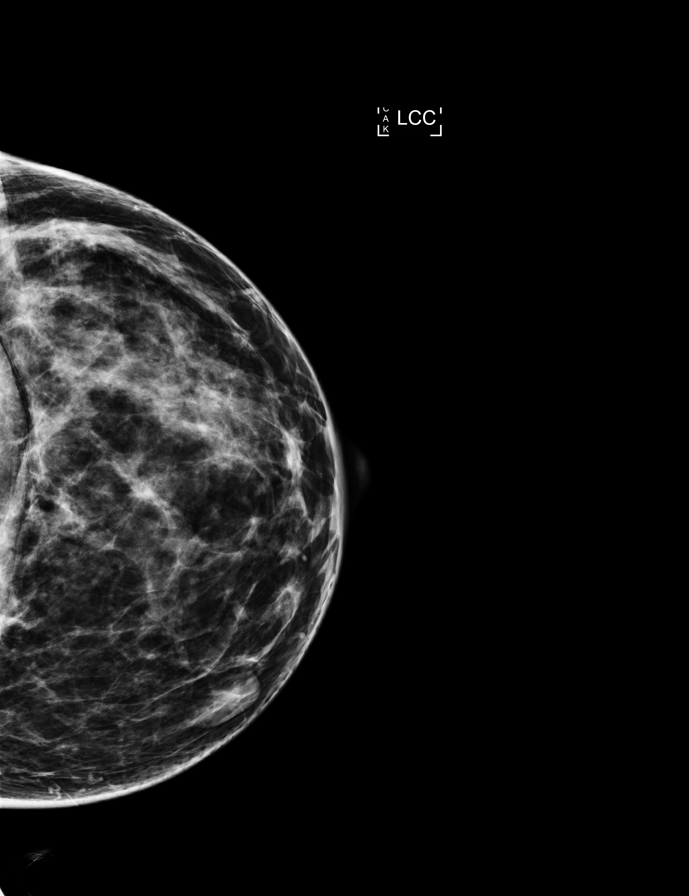

[R CC (2 of 2)]
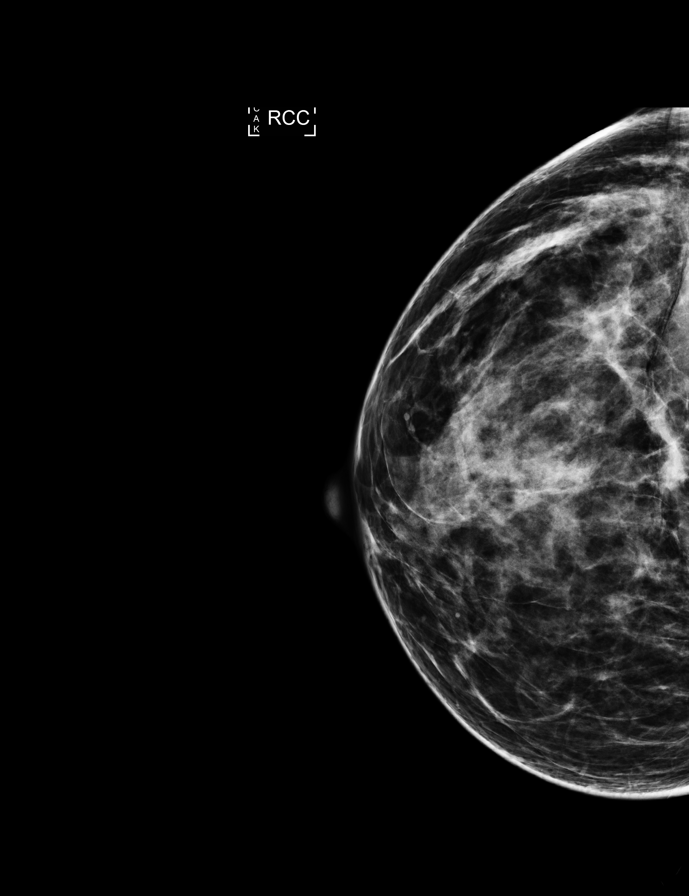

[L MLO]
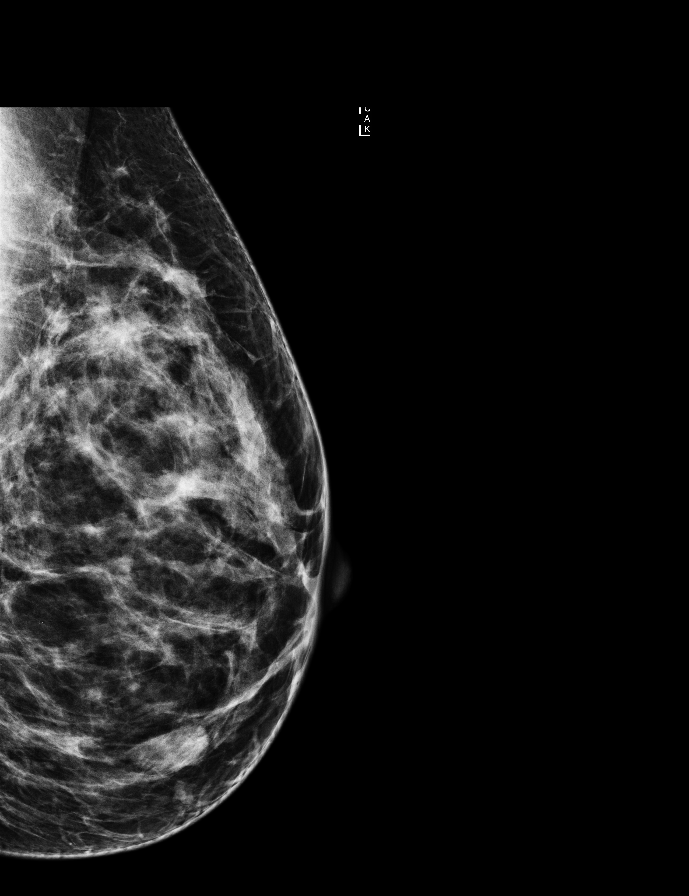

[R MLO]
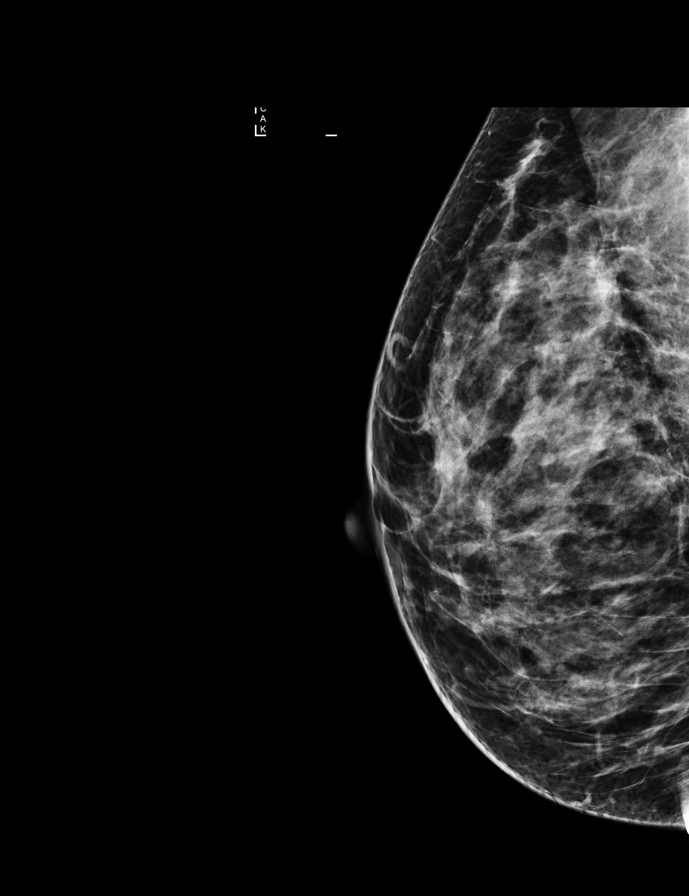

[R MLO tomo · tomo slice 33/66.0]
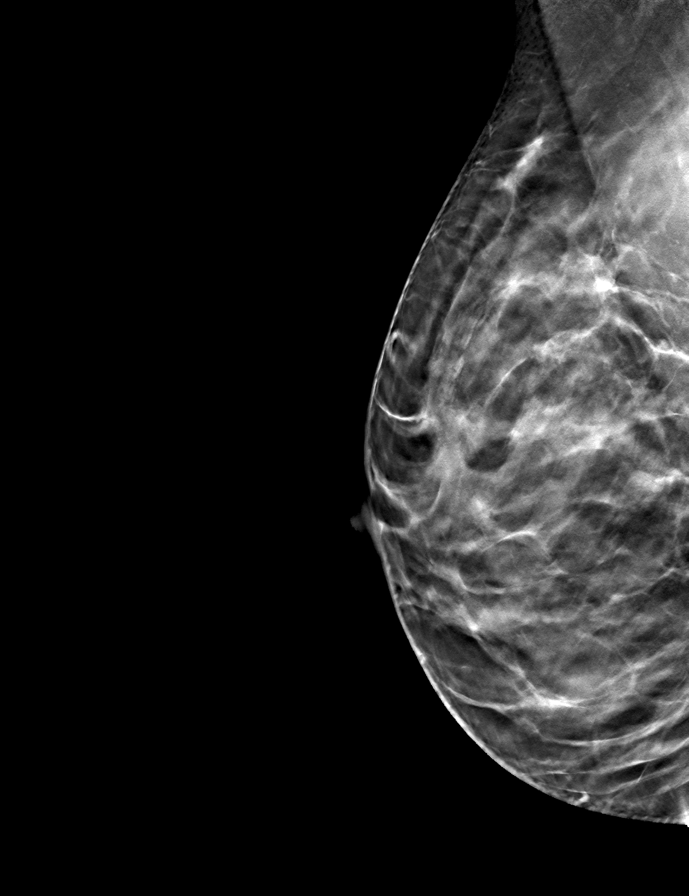

[L MLO tomo · tomo slice 35/68.0]
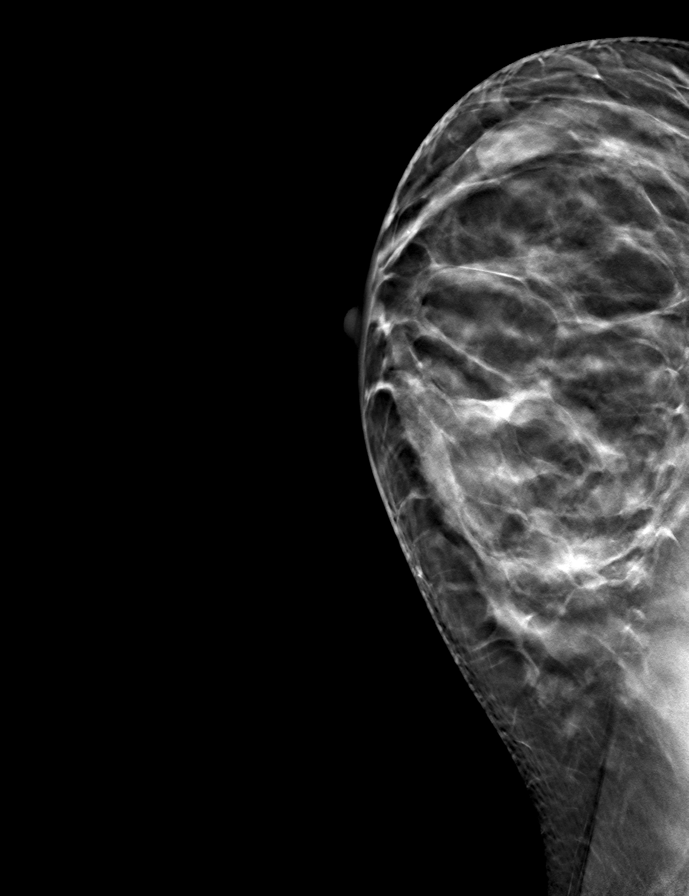

[L CC tomo · tomo slice 36/71.0]
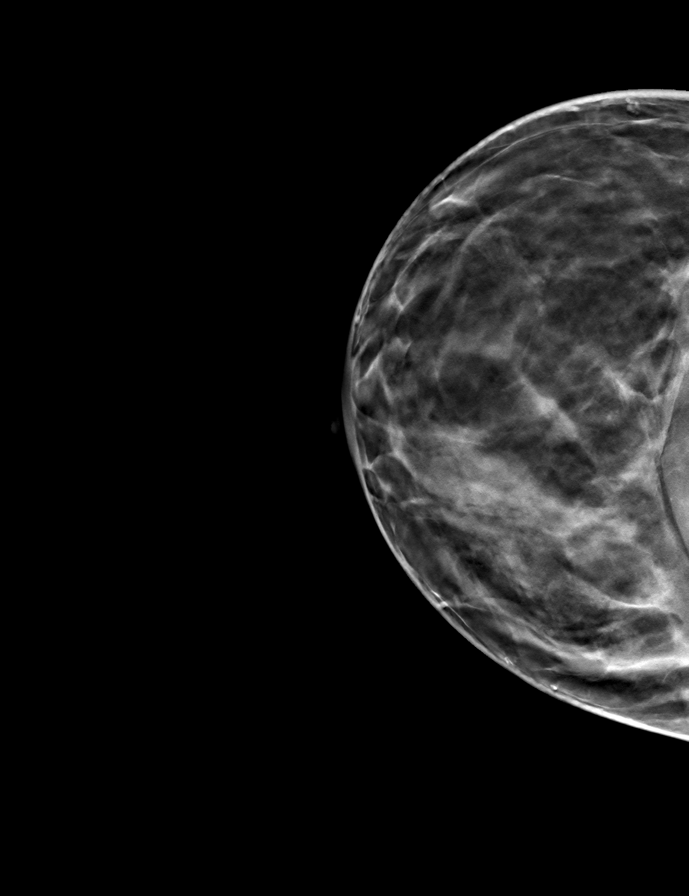

[R CC tomo · tomo slice 35/69.0]
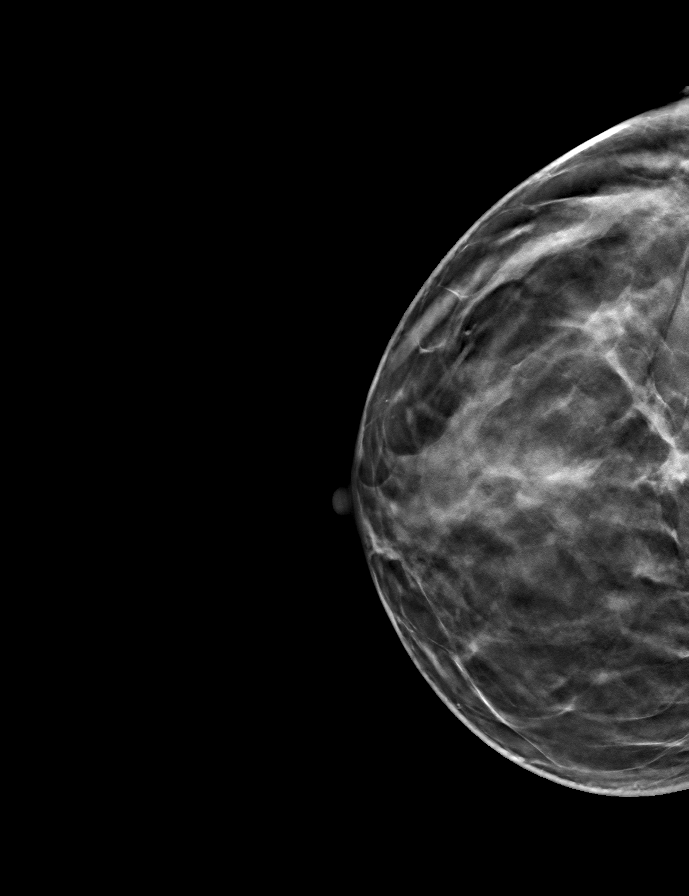

[9 of 25 positions shown; findings below may reference images not displayed]

ACR Breast Density Category c: The breast tissue is heterogeneously
dense, which may obscure small masses.
FINDINGS: In the left breast, a possible mass warrants further evaluation. In
the right breast, no findings suspicious for malignancy.

Images were processed with CAD.
IMPRESSION: Further evaluation is suggested for possible mass in the left
breast.

RECOMMENDATION:
Diagnostic mammogram and possibly ultrasound of the left breast.
(Code:[id number])

The patient will be contacted regarding the findings, and additional
imaging will be scheduled.

BI-RADS CATEGORY  0: Incomplete. Need additional imaging evaluation
and/or prior mammograms for comparison.

## 2018-08-25 ENCOUNTER — Telehealth: Payer: Self-pay | Admitting: Family

## 2018-08-25 NOTE — Telephone Encounter (Signed)
Called pt regarding her appt on 09/26/18. Informed pt that Lenna Sciara has a meeting that morning and would need to reschedule. Advised pt to call back and reschedule at her convenience.

## 2018-09-12 ENCOUNTER — Other Ambulatory Visit: Payer: Self-pay | Admitting: Internal Medicine

## 2018-09-26 ENCOUNTER — Encounter: Payer: Self-pay | Admitting: Family

## 2018-09-27 ENCOUNTER — Encounter: Payer: Self-pay | Admitting: Family

## 2018-09-27 ENCOUNTER — Telehealth: Payer: Self-pay | Admitting: Family

## 2018-09-27 ENCOUNTER — Ambulatory Visit (INDEPENDENT_AMBULATORY_CARE_PROVIDER_SITE_OTHER): Payer: BLUE CROSS/BLUE SHIELD | Admitting: Family

## 2018-09-27 VITALS — BP 130/89 | HR 97 | Temp 98.7°F | Resp 18 | Ht 62.0 in | Wt 127.8 lb

## 2018-09-27 DIAGNOSIS — Z0000 Encounter for general adult medical examination without abnormal findings: Secondary | ICD-10-CM

## 2018-09-27 DIAGNOSIS — R9431 Abnormal electrocardiogram [ECG] [EKG]: Secondary | ICD-10-CM

## 2018-09-27 DIAGNOSIS — Z0001 Encounter for general adult medical examination with abnormal findings: Secondary | ICD-10-CM

## 2018-09-27 LAB — LIPID PANEL
CHOLESTEROL: 158 mg/dL (ref 0–200)
HDL: 46.4 mg/dL (ref >39.00)
LDL Cholesterol: 94 mg/dL (ref 0–99)
NonHDL: 111.8
TRIGLYCERIDES: 87 mg/dL (ref 0.0–149.0)
Total CHOL/HDL Ratio: 3
VLDL: 17.4 mg/dL (ref 0.0–40.0)

## 2018-09-27 LAB — CBC WITH DIFFERENTIAL/PLATELET
BASOS ABS: 0 10*3/uL (ref 0.0–0.1)
Basophils Relative: 0.6 % (ref 0.0–3.0)
Eosinophils Absolute: 0.1 10*3/uL (ref 0.0–0.7)
Eosinophils Relative: 1.4 % (ref 0.0–5.0)
HCT: 45.6 % (ref 36.0–46.0)
Hemoglobin: 15.5 g/dL — ABNORMAL HIGH (ref 12.0–15.0)
LYMPHS ABS: 1.2 10*3/uL (ref 0.7–4.0)
Lymphocytes Relative: 22.7 % (ref 12.0–46.0)
MCHC: 33.9 g/dL (ref 30.0–36.0)
MCV: 90.8 fl (ref 78.0–100.0)
MONO ABS: 0.5 10*3/uL (ref 0.1–1.0)
Monocytes Relative: 9 % (ref 3.0–12.0)
NEUTROS ABS: 3.6 10*3/uL (ref 1.4–7.7)
NEUTROS PCT: 66.3 % (ref 43.0–77.0)
PLATELETS: 186 10*3/uL (ref 150.0–400.0)
RBC: 5.03 Mil/uL (ref 3.87–5.11)
RDW: 14.2 % (ref 11.5–15.5)
WBC: 5.5 10*3/uL (ref 4.0–10.5)

## 2018-09-27 LAB — URINALYSIS, ROUTINE W REFLEX MICROSCOPIC
BILIRUBIN URINE: NEGATIVE
KETONES UR: 15 — AB
LEUKOCYTES UA: NEGATIVE
NITRITE: NEGATIVE
Specific Gravity, Urine: <=1.005 — AB (ref 1.000–1.030)
Total Protein, Urine: NEGATIVE
URINE GLUCOSE: NEGATIVE
UROBILINOGEN UA: 0.2 (ref 0.0–1.0)
pH: 5.5 (ref 5.0–8.0)

## 2018-09-27 LAB — BASIC METABOLIC PANEL
BUN: 18 mg/dL (ref 6–23)
CALCIUM: 9.6 mg/dL (ref 8.4–10.5)
CO2: 24 mEq/L (ref 19–32)
CREATININE: 0.77 mg/dL (ref 0.40–1.20)
Chloride: 105 mEq/L (ref 96–112)
GFR: 87.31 mL/min (ref >60.00)
GLUCOSE: 83 mg/dL (ref 70–99)
Potassium: 4.4 mEq/L (ref 3.5–5.1)
Sodium: 138 mEq/L (ref 135–145)

## 2018-09-27 LAB — HEPATIC FUNCTION PANEL
ALK PHOS: 69 U/L (ref 39–117)
ALT: 11 U/L (ref 0–35)
AST: 16 U/L (ref 0–37)
Albumin: 4.7 g/dL (ref 3.5–5.2)
BILIRUBIN TOTAL: 0.7 mg/dL (ref 0.2–1.2)
Bilirubin, Direct: 0.1 mg/dL (ref 0.0–0.3)
TOTAL PROTEIN: 7.3 g/dL (ref 6.0–8.3)

## 2018-09-27 LAB — TSH: TSH: 1.75 u[IU]/mL (ref 0.35–4.50)

## 2018-09-27 NOTE — Telephone Encounter (Signed)
See my chart message

## 2018-09-27 NOTE — Progress Notes (Addendum)
Subjective:    Patient ID: Gloria Fox, female    DOB: December 07, 1975, 42 y.o.   MRN: [id number]  HPI   Patient presents today for complete physical.  Immunizations: tetanus 2017, flu up to date. Diet: healthy Exercise:  1-2 times a week 30-45 minutes, walks Colonoscopy: 5/19 Pap:  09/15/16 Mammogram: scheduled on Monday- up to date Vision: 1 year ago Dental:  Beginning of this month      Review of Systems  Constitutional: Negative for unexpected weight change.  HENT: Negative for hearing loss and rhinorrhea.   Eyes: Negative for visual disturbance.  Respiratory: Negative for cough and shortness of breath.   Cardiovascular: Negative for chest pain, palpitations and leg swelling.  Gastrointestinal: Negative for constipation and diarrhea.  Genitourinary: Negative for dysuria, frequency and hematuria.  Musculoskeletal: Negative for arthralgias and myalgias.  Skin: Negative for rash.  Neurological: Negative for headaches.  Hematological: Negative for adenopathy.  Psychiatric/Behavioral:       Denies depression/anxiety   Past Medical History:  Diagnosis Date  . Allergy    SEASONAL  . Anemia   . Colitis 01/12/12   left colon  . History of bursitis    right shoulder  . Lactose intolerance 07/03/2018   By hx  . Sessile rectal polyp 01/12/12     Social History   Socioeconomic History  . Marital status: Married    Spouse name: Not on file  . Number of children: 2  . Years of education: Not on file  . Highest education level: Not on file  Occupational History  . Occupation: Nurse  Social Needs  . Financial resource strain: Not on file  . Food insecurity:    Worry: Not on file    Inability: Not on file  . Transportation needs:    Medical: Not on file    Non-medical: Not on file  Tobacco Use  . Smoking status: Never Smoker  . Smokeless tobacco: Never Used  Substance and Sexual Activity  . Alcohol use: No  . Drug use: No  . Sexual activity: Yes    Birth  control/protection: Other-see comments    Comment: Vasectomy  Lifestyle  . Physical activity:    Days per week: Not on file    Minutes per session: Not on file  . Stress: Not on file  Relationships  . Social connections:    Talks on phone: Not on file    Gets together: Not on file    Attends religious service: Not on file    Active member of club or organization: Not on file    Attends meetings of clubs or organizations: Not on file    Relationship status: Not on file  . Intimate partner violence:    Fear of current or ex partner: Not on file    Emotionally abused: Not on file    Physically abused: Not on file    Forced sexual activity: Not on file  Other Topics Concern  . Not on file  Social History Narrative   Married   2 sons- 1999 and 2001 - one in college one doing Chartered certified accountant and apprenticeship - Automotive engineer - [employer]   Enjoys spending time with family, beach, [place].    Never smoker/tobacco, no drugs, EtOH    Past Surgical History:  Procedure Laterality Date  . COLONOSCOPY  01/12/2012   repeat 2019 - Left UC  . CYSTECTOMY     reports hx of urethral cyst  . URETHRAL CYST  REMOVAL      Family History  Problem Relation Age of Onset  . Hypertension Mother   . Cancer Mother        sinus cancer  . Hypertension Father   . Hypertension Sister   . Colon cancer Maternal Grandmother 51  . Dementia Maternal Grandmother   . Alcohol abuse Maternal Grandfather   . Alcohol abuse Paternal Grandmother     Allergies  Allergen Reactions  . Cephalexin Rash  . Sulfa Drugs Cross Reactors Rash    Current Outpatient Medications on File Prior to Visit  Medication Sig Dispense Refill  . CANASA 1000 MG suppository INSERT 1 SUPPOSITORY RECTALLY AT BEDTIME (Patient taking differently: as needed. Insert 1 suppository rectally at bedtime) 90 suppository 3  . hyoscyamine (LEVSIN SL) 0.125 MG SL tablet DISSOLVE 1 TABLET UNDER THE SKIN EVERY 4 HOURS AS NEEDED 90 tablet 0    . loratadine (CLARITIN) 10 MG tablet Take 10 mg by mouth daily.    . mesalamine (LIALDA) 1.2 g EC tablet Take 2 tablets (2.4 g total) by mouth 2 (two) times daily. 360 tablet 3   No current facility-administered medications on file prior to visit.     BP 130/89 (BP Location: Left Arm, Patient Position: Sitting, Cuff Size: Normal)   Pulse 97   Temp 98.7 F (37.1 C) (Oral)   Resp 18   Ht 5' 2"  (1.575 m)   Wt 127 lb 12.8 oz (58 kg)   SpO2 100%   BMI 23.37 kg/m       Objective:   Physical Exam  Physical Exam  Constitutional: She is oriented to person, place, and time. She appears well-developed and well-nourished. No distress.  HENT:  Head: Normocephalic and atraumatic.  Right Ear: Tympanic membrane and ear canal normal.  Left Ear: Tympanic membrane and ear canal normal.  Mouth/Throat: Oropharynx is clear and moist.  Eyes: Pupils are equal, round, and reactive to light. No scleral icterus.  Neck: Normal range of motion. No thyromegaly present.  Cardiovascular: Normal rate and regular rhythm.   No murmur heard. Pulmonary/Chest: Effort normal and breath sounds normal. No respiratory distress. He has no wheezes. She has no rales. She exhibits no tenderness.  Abdominal: Soft. Bowel sounds are normal. She exhibits no distension and no mass. There is no tenderness. There is no rebound and no guarding.  Musculoskeletal: She exhibits no edema.  Lymphadenopathy:    She has no cervical adenopathy.  Neurological: She is alert and oriented to person, place, and time. She has normal patellar reflexes. She exhibits normal muscle tone. Coordination normal.  Skin: Skin is warm and dry.  Psychiatric: She has a normal mood and affect. Her behavior is normal. Judgment and thought content normal.  Breasts: Examined lying Right: Without masses, retractions, discharge or axillary adenopathy.  Left: Without retractions, discharge or axillary adenopathy.  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  Soft and nontender. Pelvic: deferred.       Assessment & Plan:    Preventative care- immunizations reviewed and up to date. Weight is WNL.  Pap up to date.  Obtain routine lab work.  She is advised to keep her upcoming follow-up left diagnostic mammogram on 11/18.  Short PR interval-EKG tracing is personally reviewed.  EKG notes NSR.  No acute changes. Note is made of short PR which is new.  I did review her EKG with cardiology, Dr.Revenkar.  He recommended an exercise tolerance test negative no further work-up  at this time.    Assessment & Plan:

## 2018-09-27 NOTE — Patient Instructions (Signed)
Please complete lab work prior to leaving.   

## 2018-09-29 ENCOUNTER — Encounter: Payer: Self-pay | Admitting: Family

## 2018-09-29 DIAGNOSIS — R8290 Unspecified abnormal findings in urine: Secondary | ICD-10-CM

## 2018-10-09 ENCOUNTER — Ambulatory Visit
Admission: RE | Admit: 2018-10-09 | Discharge: 2018-10-09 | Disposition: A | Discharge status: Home / Self Care | Payer: BLUE CROSS/BLUE SHIELD | Source: Ambulatory Visit | Attending: Family | Admitting: Family

## 2018-10-09 ENCOUNTER — Other Ambulatory Visit: Payer: Self-pay | Admitting: Family

## 2018-10-09 DIAGNOSIS — N6320 Unspecified lump in the left breast, unspecified quadrant: Secondary | ICD-10-CM

## 2018-10-09 DIAGNOSIS — R922 Inconclusive mammogram: Secondary | ICD-10-CM | POA: Diagnosis not present

## 2018-10-12 ENCOUNTER — Ambulatory Visit
Admission: RE | Admit: 2018-10-12 | Discharge: 2018-10-12 | Disposition: A | Discharge status: Home / Self Care | Payer: BLUE CROSS/BLUE SHIELD | Source: Ambulatory Visit | Attending: Family | Admitting: Family

## 2018-10-12 DIAGNOSIS — N6320 Unspecified lump in the left breast, unspecified quadrant: Secondary | ICD-10-CM

## 2018-10-12 DIAGNOSIS — N62 Hypertrophy of breast: Secondary | ICD-10-CM | POA: Diagnosis not present

## 2018-10-12 DIAGNOSIS — N6324 Unspecified lump in the left breast, lower inner quadrant: Secondary | ICD-10-CM | POA: Diagnosis not present

## 2018-10-31 ENCOUNTER — Encounter: Payer: Self-pay | Admitting: Family

## 2018-11-13 ENCOUNTER — Encounter: Payer: Self-pay | Admitting: Medical

## 2018-11-13 ENCOUNTER — Ambulatory Visit (HOSPITAL_BASED_OUTPATIENT_CLINIC_OR_DEPARTMENT_OTHER)
Admission: RE | Admit: 2018-11-13 | Discharge: 2018-11-13 | Disposition: A | Discharge status: Home / Self Care | Payer: BLUE CROSS/BLUE SHIELD | Source: Ambulatory Visit | Attending: Medical | Admitting: Medical

## 2018-11-13 ENCOUNTER — Ambulatory Visit (INDEPENDENT_AMBULATORY_CARE_PROVIDER_SITE_OTHER): Payer: BLUE CROSS/BLUE SHIELD | Admitting: Medical

## 2018-11-13 VITALS — BP 130/73 | HR 87 | Temp 98.2°F | Resp 16 | Ht 62.0 in | Wt 127.8 lb

## 2018-11-13 DIAGNOSIS — R059 Cough, unspecified: Secondary | ICD-10-CM

## 2018-11-13 DIAGNOSIS — J40 Bronchitis, not specified as acute or chronic: Secondary | ICD-10-CM | POA: Diagnosis not present

## 2018-11-13 DIAGNOSIS — R062 Wheezing: Secondary | ICD-10-CM

## 2018-11-13 DIAGNOSIS — R05 Cough: Secondary | ICD-10-CM

## 2018-11-13 MED ORDER — FLUTICASONE PROPIONATE HFA 110 MCG/ACT IN AERO: 2.0000 | INHALATION_SPRAY | Freq: Two times a day (BID) | RESPIRATORY_TRACT | 12 refills | Status: DC

## 2018-11-13 MED ORDER — ALBUTEROL SULFATE HFA 108 (90 BASE) MCG/ACT IN AERS: 2.0000 | INHALATION_SPRAY | Freq: Four times a day (QID) | RESPIRATORY_TRACT | 2 refills | Status: DC | PRN

## 2018-11-13 MED ORDER — AZITHROMYCIN 250 MG PO TABS: ORAL_TABLET | ORAL | 0 refills | Status: DC

## 2018-11-13 MED ORDER — HYDROCODONE-HOMATROPINE 5-1.5 MG/5ML PO SYRP: 5.0000 mL | ORAL_SOLUTION | Freq: Four times a day (QID) | ORAL | 0 refills | Status: DC | PRN

## 2018-11-13 MED FILL — HYDROCODONE-HOMATROPINE SOL: 5-1.5 | 5 days supply | Qty: 100 | Fill #0

## 2018-11-13 MED FILL — VENTOLIN HFA 90 MCG INHALER: 108 (90 BAS | 25 days supply | Qty: 18 | Fill #0

## 2018-11-13 MED FILL — AZITHROMYCIN 250 MG TABLET: 250 | 5 days supply | Qty: 6 | Fill #0

## 2018-11-13 MED FILL — FLOVENT HFA 110 MCG INHALER: 110 | 30 days supply | Qty: 12 | Fill #0

## 2018-11-13 NOTE — Patient Instructions (Signed)
Your airways do appear to be reactive with wheezing recently.  Persisting moderate cough for 2 weeks.  Placed chest x-ray to be done today.  For wheezing, I prescribed Flovent steroid inhaler 2 puffs twice daily and albuterol 2 puffs every 6 hours as needed for wheezing.  Making Hycodan cough syrup available for severe cough.  Rx advisement given.  If chest x-ray positive for pneumonia or productive cough/signs symptoms persist despite above measures then start a azithromycin.  Follow-up in 7 to 10 days or as needed.

## 2018-11-13 NOTE — Progress Notes (Signed)
Subjective:    Patient ID: Gloria Fox, female    DOB: 01/10/1976, 42 y.o.   MRN: [id number]  HPI   Pt in states cough for 2 weeks. Cough is day and night. Pt states some expiratory wheeze. Some pnd. This morning blew her nose and saw faint yellow tinged.   No fever, no chills, no sweats and does not smoke.   No hx of any inhaler use.  Pt thinks maybe not bacterial illness.   Pt has tried some dayquil. Pt also using nasocort.  No heart burn.  lmp- 2 weeks ago,     Review of Systems  Constitutional: Negative for chills, fatigue and fever.  HENT: Negative for congestion, ear discharge, ear pain, hearing loss, mouth sores, postnasal drip, sinus pressure and sinus pain.   Respiratory: Positive for cough and wheezing. Negative for chest tightness and shortness of breath.        Slight expiratory wheeze last night. More than just transient.  Cardiovascular: Negative for chest pain and palpitations.  Gastrointestinal: Negative for abdominal pain.  Musculoskeletal: Negative for back pain and myalgias.  Skin: Negative for rash.  Neurological: Negative for dizziness and light-headedness.  Hematological: Negative for adenopathy. Does not bruise/bleed easily.  Psychiatric/Behavioral: Negative for behavioral problems and confusion.   Past Medical History:  Diagnosis Date  . Allergy    SEASONAL  . Anemia   . Colitis 01/12/12   left colon  . History of bursitis    right shoulder  . Lactose intolerance 07/03/2018   By hx  . Sessile rectal polyp 01/12/12     Social History   Socioeconomic History  . Marital status: Married    Spouse name: Not on file  . Number of children: 2  . Years of education: Not on file  . Highest education level: Not on file  Occupational History  . Occupation: Nurse  Social Needs  . Financial resource strain: Not on file  . Food insecurity:    Worry: Not on file    Inability: Not on file  . Transportation needs:    Medical: Not on file     Non-medical: Not on file  Tobacco Use  . Smoking status: Never Smoker  . Smokeless tobacco: Never Used  Substance and Sexual Activity  . Alcohol use: No  . Drug use: No  . Sexual activity: Yes    Birth control/protection: Other-see comments    Comment: Vasectomy  Lifestyle  . Physical activity:    Days per week: Not on file    Minutes per session: Not on file  . Stress: Not on file  Relationships  . Social connections:    Talks on phone: Not on file    Gets together: Not on file    Attends religious service: Not on file    Active member of club or organization: Not on file    Attends meetings of clubs or organizations: Not on file    Relationship status: Not on file  . Intimate partner violence:    Fear of current or ex partner: Not on file    Emotionally abused: Not on file    Physically abused: Not on file    Forced sexual activity: Not on file  Other Topics Concern  . Not on file  Social History Narrative   Married   2 sons- 1999 and 2001 - one in college one doing Chartered certified accountant and apprenticeship - Automotive engineer - [place]   Enjoys spending  time with family, beach, mountains.    Never smoker/tobacco, no drugs, EtOH    Past Surgical History:  Procedure Laterality Date  . COLONOSCOPY  01/12/2012   repeat 2019 - Left UC  . CYSTECTOMY     reports hx of urethral cyst  . URETHRAL CYST REMOVAL      Family History  Problem Relation Age of Onset  . Hypertension Mother   . Cancer Mother        sinus cancer  . Hypertension Father   . Hypertension Sister   . Colon cancer Maternal Grandmother 19  . Dementia Maternal Grandmother   . Alcohol abuse Maternal Grandfather   . Alcohol abuse Paternal Grandmother     Allergies  Allergen Reactions  . Cephalexin Rash  . Sulfa Drugs Cross Reactors Rash    Current Outpatient Medications on File Prior to Visit  Medication Sig Dispense Refill  . CANASA 1000 MG suppository INSERT 1 SUPPOSITORY RECTALLY AT BEDTIME  (Patient taking differently: as needed. Insert 1 suppository rectally at bedtime) 90 suppository 3  . hyoscyamine (LEVSIN SL) 0.125 MG SL tablet DISSOLVE 1 TABLET UNDER THE SKIN EVERY 4 HOURS AS NEEDED 90 tablet 0  . loratadine (CLARITIN) 10 MG tablet Take 10 mg by mouth daily.    . mesalamine (LIALDA) 1.2 g EC tablet Take 2 tablets (2.4 g total) by mouth 2 (two) times daily. 360 tablet 3   No current facility-administered medications on file prior to visit.     BP 130/73   Pulse 87   Temp 98.2 F (36.8 C) (Oral)   Resp 16   Ht 5' 2"  (1.575 m)   Wt 127 lb 12.8 oz (58 kg)   SpO2 100%   BMI 23.37 kg/m       Objective:   Physical Exam  General  Mental Status - Alert. General Appearance - Well groomed. Not in acute distress.  Skin Rashes- No Rashes.  HEENT Head- Normal. Ear Auditory Canal - Left- Normal. Right - Normal.Tympanic Membrane- Left- Normal. Right- Normal. Eye Sclera/Conjunctiva- Left- Normal. Right- Normal. Nose & Sinuses Nasal Mucosa- Left-  Boggy and Congested. Right-  Boggy and  Congested.Bilateral maxillary and frontal sinus pressure. Mouth & Throat Lips: Upper Lip- Normal: no dryness, cracking, pallor, cyanosis, or vesicular eruption. Lower Lip-Normal: no dryness, cracking, pallor, cyanosis or vesicular eruption. Buccal Mucosa- Bilateral- No Aphthous ulcers. Oropharynx- No Discharge or Erythema. + pnd. Tonsils: Characteristics- Bilateral- No Erythema or Congestion. Size/Enlargement- Bilateral- No enlargement. Discharge- bilateral-None.  Neck Neck- Supple. No Masses.   Chest and Lung Exam Auscultation: Breath Sounds:-Clear even and unlabored. But coughs on each deep breath.  Cardiovascular Auscultation:Rythm- Regular, rate and rhythm. Murmurs & Other Heart Sounds:Ausculatation of the heart reveal- No Murmurs.  Lymphatic Head & Neck General Head & Neck Lymphatics: Bilateral: Description- No Localized lymphadenopathy.       Assessment & Plan:    Your airways do appear to be reactive with wheezing recently.  Persisting moderate cough for 2 weeks.  Placed chest x-ray to be done today.  For wheezing, I prescribed Flovent steroid inhaler 2 puffs twice daily and albuterol 2 puffs every 6 hours as needed for wheezing.  Making Hycodan cough syrup available for severe cough.  Rx advisement given.  If chest x-ray positive for pneumonia or productive cough/signs symptoms persist despite above measures then start a azithromycin.  Follow-up in 7 to 10 days or as needed.  Mackie Pai, PA-C

## 2018-11-24 ENCOUNTER — Other Ambulatory Visit: Payer: Self-pay | Admitting: Internal Medicine

## 2018-11-24 MED ORDER — PREDNISONE 10 MG PO TABS: 40.0000 mg | ORAL_TABLET | Freq: Every day | ORAL | 0 refills | Status: DC

## 2018-12-12 ENCOUNTER — Other Ambulatory Visit: Payer: Self-pay | Admitting: Internal Medicine

## 2018-12-15 ENCOUNTER — Other Ambulatory Visit: Payer: Self-pay

## 2018-12-15 MED ORDER — MESALAMINE 1000 MG RE SUPP: RECTAL | 3 refills | Status: DC

## 2018-12-15 MED ORDER — MESALAMINE 1000 MG RE SUPP: RECTAL | 0 refills | Status: DC

## 2018-12-15 NOTE — Addendum Note (Signed)
Addended by: Marlon Pel on: 12/15/2018 09:23 AM   Modules accepted: Orders

## 2018-12-21 ENCOUNTER — Other Ambulatory Visit: Payer: Self-pay | Admitting: Internal Medicine

## 2018-12-21 ENCOUNTER — Other Ambulatory Visit: Payer: BLUE CROSS/BLUE SHIELD

## 2018-12-21 DIAGNOSIS — K51518 Left sided colitis with other complication: Secondary | ICD-10-CM

## 2018-12-21 MED ORDER — MESALAMINE 1000 MG RE SUPP: RECTAL | 0 refills | Status: DC

## 2018-12-26 LAB — QUANTIFERON-TB GOLD PLUS
Mitogen-NIL: >10 [IU]/mL
NIL: 0.06 [IU]/mL
QuantiFERON-TB Gold Plus: NEGATIVE
TB1-NIL: <0 [IU]/mL
TB2-NIL: <0 [IU]/mL

## 2018-12-26 LAB — THIOPURINE METHYLTRANSFERASE (TPMT), RBC: Thiopurine Methyltransferase, RBC: 17 nmol/h/mL

## 2018-12-26 LAB — HEPATITIS B SURFACE ANTIBODY, QUANTITATIVE: Hep B S AB Quant (Post): 183 m[IU]/mL

## 2018-12-27 NOTE — Progress Notes (Signed)
Pre-biologic and 6 MP labs ok  Please set up office visit so we can discuss biologic Tx

## 2019-01-01 ENCOUNTER — Ambulatory Visit (INDEPENDENT_AMBULATORY_CARE_PROVIDER_SITE_OTHER): Payer: BLUE CROSS/BLUE SHIELD | Admitting: Internal Medicine

## 2019-01-01 ENCOUNTER — Encounter: Payer: Self-pay | Admitting: Internal Medicine

## 2019-01-01 DIAGNOSIS — K51518 Left sided colitis with other complication: Secondary | ICD-10-CM

## 2019-01-01 NOTE — Progress Notes (Signed)
Gloria Fox 43 y.o. 10-03-1976 [id number]  Assessment & Plan:  Left sided ulcerative colitis ([place]) Taper off prednisone go to 5 mg qd x 1 week after 1 week at 10 mg qd and then stop Stay on Lialda and canasa Hold off on biologics/immunomodulators Return visit routinely in about 6 months sooner if needed  I appreciate the opportunity to care for this patient.  CC: Debbrah Alar, NP    Subjective:   Chief Complaint: Follow-up of left-sided ulcerative colitis  HPI Gloria Fox is here for follow-up of her ulcerative colitis, she has left-sided ulcerative colitis involving the rectum and sigmoid, more so in the rectum.  She needed prednisone recently because of a flare with diarrhea and rectal pain.  She felt like that helped some but it was not holding her so she requested to start using Canasa suppositories again and she has been doing that with market improvement in her rectal soreness and diarrhea.  No more bleeding at all.  We had done screening tests with QuantiFERON and TPMT enzyme levels in anticipation of starting Biologics but now since she is feeling well and needs a breast surgery for a benign growth she is thinking she is satisfied with her quality of life and would like to hold off. Allergies  Allergen Reactions  . Cephalexin Rash  . Sulfa Drugs Cross Reactors Rash   Current Meds  Medication Sig  . hyoscyamine (LEVSIN SL) 0.125 MG SL tablet DISSOLVE 1 TABLET UNDER THE SKIN EVERY 4 HOURS AS NEEDED  . loratadine (CLARITIN) 10 MG tablet Take 10 mg by mouth daily.  . mesalamine (CANASA) 1000 MG suppository INSERT 1 SUPPOSITORY RECTALLY AT BEDTIME  . mesalamine (LIALDA) 1.2 g EC tablet Take 2 tablets (2.4 g total) by mouth 2 (two) times daily.  . predniSONE (DELTASONE) 10 MG tablet Take 4 tablets (40 mg total) by mouth daily with breakfast. (Patient taking differently: Take 10 mg by mouth daily with breakfast. Finishing taper)   Past Medical History:  Diagnosis  Date  . Allergy    SEASONAL  . Anemia   . Colitis 01/12/12   left colon  . History of bursitis    right shoulder  . Lactose intolerance 07/03/2018   By hx  . Pseudoangiomatous stromal hyperplasia of breast    left  . Sessile rectal polyp 01/12/12   Past Surgical History:  Procedure Laterality Date  . COLONOSCOPY  01/12/2012   repeat 2019 - Left UC  . CYSTECTOMY     reports hx of urethral cyst  . URETHRAL CYST REMOVAL     Social History   Social History Narrative   Married   2 sons- 1999 and 2001 - one in college one doing Chartered certified accountant and apprenticeship - [place]   Enjoys spending time with family, beach, [place].    Never smoker/tobacco, no drugs, EtOH   family history includes Alcohol abuse in her maternal grandfather and paternal grandmother; Cancer in her mother; Colon cancer (age of onset: 64) in her maternal grandmother; Dementia in her maternal grandmother; Hypertension in her father, mother, and sister.   Review of Systems See HPI  Objective:   Physical Exam BP 106/64   Pulse (!) 104   Ht 5' 2"  (1.575 m)   Wt 131 lb (59.4 kg)   LMP 12/28/2018 (Exact Date)   BMI 23.96 kg/m  No acute distress  15 minutes time spent with patient > half in counseling coordination of care

## 2019-01-01 NOTE — Patient Instructions (Signed)
  Please call us in June for an August appointment.   Take your prednisone as follows: 60m for a week and then 560mfor a week then stop.   I appreciate the opportunity to care for you. CaSilvano RuskMD, FAPiedmont Athens Regional Med Center

## 2019-01-01 NOTE — Assessment & Plan Note (Addendum)
Taper off prednisone go to 5 mg qd x 1 week after 1 week at 10 mg qd and then stop Stay on Lialda and canasa Hold off on biologics/immunomodulators Return visit routinely in about 6 months sooner if needed

## 2019-02-14 DIAGNOSIS — R319 Hematuria, unspecified: Secondary | ICD-10-CM | POA: Diagnosis not present

## 2019-02-26 ENCOUNTER — Other Ambulatory Visit: Payer: Self-pay | Admitting: Emergency Medicine

## 2019-02-26 ENCOUNTER — Other Ambulatory Visit: Payer: Self-pay | Admitting: Family

## 2019-02-26 DIAGNOSIS — N6320 Unspecified lump in the left breast, unspecified quadrant: Secondary | ICD-10-CM

## 2019-02-27 ENCOUNTER — Telehealth: Payer: Self-pay | Admitting: Family

## 2019-02-27 NOTE — Telephone Encounter (Signed)
-----   Message from Justin Mend, MD sent at 02/27/2019  7:56 AM EDT ----- No, no concern.  I didn't see any when I looked on urine microscopy.   Typically when epithelial cells are reported on a UA they are renal tubular epithelial cells but rather skin epithelial cells that are a 'contaminant' not pathologic.   Let me know of any other concerns.   Ria Comment ----- Message ----- From: Debbrah Alar, NP Sent: 02/26/2019  10:59 AM EDT To: Justin Mend, MD  Dear Dr. Johnney Ou,  Thank you for your consult on our mutual patient Gloria Fox.  My primary question in regards to her referral was if you have any concern about our incidental finding of renal epithelial cells in her urinalysis?  Thanks,  [employer]

## 2019-04-12 ENCOUNTER — Other Ambulatory Visit: Payer: Self-pay

## 2019-04-12 ENCOUNTER — Ambulatory Visit
Admission: RE | Admit: 2019-04-12 | Discharge: 2019-04-12 | Disposition: A | Discharge status: Home / Self Care | Payer: BLUE CROSS/BLUE SHIELD | Source: Ambulatory Visit | Attending: Family | Admitting: Family

## 2019-04-12 DIAGNOSIS — N6320 Unspecified lump in the left breast, unspecified quadrant: Secondary | ICD-10-CM

## 2019-04-18 ENCOUNTER — Other Ambulatory Visit: Payer: Self-pay | Admitting: General Surgery

## 2019-04-18 DIAGNOSIS — N6324 Unspecified lump in the left breast, lower inner quadrant: Secondary | ICD-10-CM | POA: Diagnosis not present

## 2019-04-18 DIAGNOSIS — K5190 Ulcerative colitis, unspecified, without complications: Secondary | ICD-10-CM | POA: Diagnosis not present

## 2019-04-18 DIAGNOSIS — N6320 Unspecified lump in the left breast, unspecified quadrant: Secondary | ICD-10-CM

## 2019-04-19 ENCOUNTER — Other Ambulatory Visit: Payer: Self-pay | Admitting: General Surgery

## 2019-04-19 DIAGNOSIS — N6320 Unspecified lump in the left breast, unspecified quadrant: Secondary | ICD-10-CM

## 2019-04-27 ENCOUNTER — Encounter (HOSPITAL_BASED_OUTPATIENT_CLINIC_OR_DEPARTMENT_OTHER): Payer: Self-pay | Admitting: *Deleted

## 2019-04-27 ENCOUNTER — Other Ambulatory Visit: Payer: Self-pay

## 2019-04-30 NOTE — H&P (Signed)
Gloria Fox Location: [facility] Patient #: [id number] DOB: Dec 13, 1975 Married / Language: English / Race: White Female      History of Present Illness      . This is a very pleasant 43 year old female, referred by [name] for evaluation of enlarging left breast mass, lower inner quadrant. [name], NP provides primary care.     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 A density was found in the left breast lower inner quadrant on mammogram 1-2 years ago. She says she can now feel this. No nipple discharge or abnormality there. No prior breast surgery She says it is enlarging. She says it is a little bit painful. 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 This was thought to be a 2.5 x 0.9 x 1.9 cm mass in the left breast, 8 o'clock position, 4 cm from nipple ultrasound was performed on Apr 12, 2019 and it measures a few millimeters larger according to [name]. Although this is low risk histologically it is enlarging, is a solitary mass, is a little bit painful and is causing increasing anxiety. She requested surgical referral      Past history is significant for mild ulcerative colitis followed by Dr. [name]. Does not take any steroids or strong immune modulators. Surgery for urethral cyst. Basically healthy. Family history reveals a great aunt had breast cancer. Mother living had sinus cancer and hypertension. Father has hypertension. A maternal grandmother had colon cancer. No ovarian cancer Stenosis reveals she is married with 2 children. [place] in [place]. 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 Denies alcohol or tobacco.       On exam I can feel slightly irregular rubbery mobile mass at the 8 o'clock position of the left breast, 3-4 cm from the areolar margin. Feels more like a fibroadenoma than anything. No other abnormalities       She requested this area be excised. I think that is reasonable She'll be scheduled for left breast lipectomy with radioactive seed localization. I discussed the indications, details, techniques, numerous risk of the surgery with her. She is aware of the risk of bleeding, infection, reoperation if cancer, nerve damage with chronic pain cosmetic deformity. She understands all these issues. All questions were answered. She agrees with this plan.   We will try to remove this fairly conservatively with a small incision    Diagnostic Studies History  Colonoscopy  within last year Mammogram  within last year Pap Smear  1-5 years ago  Allergies Sulfa Drugs  Keflex *CEPHALOSPORINS*  Allergies Reconciled   Medication History  Canasa (1000MG Suppository, Rectal) Active. Mesalamine (1.2GM Tablet DR, Oral) Active. Medications Reconciled  Social History  No alcohol use  No caffeine use  No drug use  Tobacco use  Never smoker.  Family History Alcohol Abuse  Father. Hypertension  Father, Mother, Sister. Migraine Headache  Sister.  Pregnancy / Birth History  Age at menarche  36 years. Contraceptive History  Oral contraceptives. Gravida  2 Length (months) of breastfeeding  7-12 Maternal age  63-25 Para  2 Regular periods   Other Problems  Lump In Breast  Ulcerative Colitis     Review of Systems  General Not Present- Appetite Loss, Chills, Fatigue, Fever, Night Sweats, Weight Gain and Weight Loss. Skin Not Present- Change in Wart/Mole, Dryness, Hives, Jaundice, New Lesions, Non-Healing Wounds, Rash and  Ulcer. HEENT Present- Seasonal Allergies and Wears glasses/contact lenses. Not Present- Earache, Hearing Loss, Hoarseness, Nose Bleed, Oral Ulcers, Ringing in the Ears, Sinus Pain, Sore Throat, Visual Disturbances and Yellow Eyes. Respiratory Not Present- Bloody sputum, Chronic Cough, Difficulty Breathing, Snoring and Wheezing. Cardiovascular Not  Present- Chest Pain, Difficulty Breathing Lying Down, Leg Cramps, Palpitations, Rapid Heart Rate, Shortness of Breath and Swelling of Extremities. Gastrointestinal Not Present- Abdominal Pain, Bloating, Bloody Stool, Change in Bowel Habits, Chronic diarrhea, Constipation, Difficulty Swallowing, Excessive gas, Gets full quickly at meals, Hemorrhoids, Indigestion, Nausea, Rectal Pain and Vomiting. Female Genitourinary Not Present- Frequency, Nocturia, Painful Urination, Pelvic Pain and Urgency. Musculoskeletal Not Present- Back Pain, Joint Pain, Joint Stiffness, Muscle Pain, Muscle Weakness and Swelling of Extremities. Neurological Not Present- Decreased Memory, Fainting, Headaches, Numbness, Seizures, Tingling, Tremor, Trouble walking and Weakness.  Vitals  Weight: 130.4 lb Height: 62in Body Surface Area: 1.59 m Body Mass Index: 23.85 kg/m  Temp.: 98.54F(Oral)  Pulse: 98 (Regular)  BP: 122/70 (Sitting, Left Arm, Standard)     Physical Exam  General Mental Status-Alert. General Appearance-Consistent with stated age. Hydration-Well hydrated. Voice-Normal. Note: Healthy. Pleasant. Good insight.   Head and Neck Head-normocephalic, atraumatic with no lesions or palpable masses. Trachea-midline. Thyroid Gland Characteristics - normal size and consistency.  Eye Eyeball - Bilateral-Extraocular movements intact. Sclera/Conjunctiva - Bilateral-No scleral icterus.  Chest and Lung Exam Chest and lung exam reveals -quiet, even and easy respiratory effort with no use of accessory muscles and on auscultation, normal breath sounds, no adventitious sounds and normal vocal resonance. Inspection Chest Wall - Normal. Back - normal.  Breast Note: Breasts are relatively large. Symmetrical. Skin healthy. In the lower inner quadrant of the left breast about 3-4 cm from the areolar margin there is a 2 cm x 1.5 cm rubbery mobile mass that is not particularly tender. It  feels like a fibroadenoma. No other masses in either breast. No axillary adenopathy.   Cardiovascular Cardiovascular examination reveals -normal heart sounds, regular rate and rhythm with no murmurs and normal pedal pulses bilaterally.  Abdomen Inspection Inspection of the abdomen reveals - No Hernias. Skin - Scar - no surgical scars. Palpation/Percussion Palpation and Percussion of the abdomen reveal - Soft, Non Tender, No Rebound tenderness, No Rigidity (guarding) and No hepatosplenomegaly. Auscultation Auscultation of the abdomen reveals - Bowel sounds normal.  Neurologic Neurologic evaluation reveals -alert and oriented x 3 with no impairment of recent or remote memory. Mental Status-Normal.  Musculoskeletal Normal Exam - Left-Upper Extremity Strength Normal and Lower Extremity Strength Normal. Normal Exam - Right-Upper Extremity Strength Normal and Lower Extremity Strength Normal.  Lymphatic Head & Neck  General Head & Neck Lymphatics: Bilateral - Description - Normal. Axillary  General Axillary Region: Bilateral - Description - Normal. Tenderness - Non Tender. Femoral & Inguinal  Generalized Femoral & Inguinal Lymphatics: Bilateral - Description - Normal. Tenderness - Non Tender.    Assessment & Plan   MASS OF LOWER INNER QUADRANT OF LEFT BREAST (N63.24)  You have a 2 cm palpable mass in the left breast, lower inner quadrant You perceive that this has been getting larger and is now somewhat painful This has been followed radiographically for a couple of years and is enlarging radiographicall Needle core biopsy shows a benign condition call [place]  Nevertheless, because it is enlarging and is a solitary finding, you have requested excision. I completely agree  you will be scheduled for left breast lumpectomy with radioactive seed localization I have discussed the indications, techniques,  and risk of the surgery in detail  MILD ULCERATIVE COLITIS  (K51.90)    Edsel Petrin. Dalbert Batman, M.D., [facility] General and Minimally invasive Surgery Breast and Colorectal Surgery Office:   [phone] Pager:   [phone]

## 2019-05-01 ENCOUNTER — Other Ambulatory Visit (HOSPITAL_COMMUNITY)
Admission: RE | Admit: 2019-05-01 | Discharge: 2019-05-01 | Disposition: A | Discharge status: Home / Self Care | Payer: BC Managed Care – PPO | Source: Ambulatory Visit | Attending: General Surgery | Admitting: General Surgery

## 2019-05-01 ENCOUNTER — Other Ambulatory Visit: Payer: Self-pay

## 2019-05-01 DIAGNOSIS — Z01812 Encounter for preprocedural laboratory examination: Secondary | ICD-10-CM | POA: Insufficient documentation

## 2019-05-01 DIAGNOSIS — Z1159 Encounter for screening for other viral diseases: Secondary | ICD-10-CM | POA: Insufficient documentation

## 2019-05-01 LAB — SARS CORONAVIRUS 2 BY RT PCR (HOSPITAL ORDER, PERFORMED IN ~~LOC~~ HOSPITAL LAB): SARS Coronavirus 2: NEGATIVE

## 2019-05-01 NOTE — Progress Notes (Signed)
Pt given Ensure and instructed to drink by 0600 day of surgery with teach back method.

## 2019-05-03 ENCOUNTER — Ambulatory Visit
Admission: RE | Admit: 2019-05-03 | Discharge: 2019-05-03 | Disposition: A | Discharge status: Home / Self Care | Payer: BC Managed Care – PPO | Source: Ambulatory Visit | Attending: General Surgery | Admitting: General Surgery

## 2019-05-03 ENCOUNTER — Other Ambulatory Visit: Payer: Self-pay

## 2019-05-03 DIAGNOSIS — N6320 Unspecified lump in the left breast, unspecified quadrant: Secondary | ICD-10-CM

## 2019-05-03 DIAGNOSIS — R928 Other abnormal and inconclusive findings on diagnostic imaging of breast: Secondary | ICD-10-CM | POA: Diagnosis not present

## 2019-05-04 ENCOUNTER — Ambulatory Visit (HOSPITAL_BASED_OUTPATIENT_CLINIC_OR_DEPARTMENT_OTHER): Payer: BC Managed Care – PPO | Admitting: Certified Registered"

## 2019-05-04 ENCOUNTER — Ambulatory Visit (HOSPITAL_BASED_OUTPATIENT_CLINIC_OR_DEPARTMENT_OTHER)
Admission: RE | Admit: 2019-05-04 | Discharge: 2019-05-04 | Disposition: A | Discharge status: Home / Self Care | Payer: BC Managed Care – PPO | Attending: General Surgery | Admitting: General Surgery

## 2019-05-04 ENCOUNTER — Other Ambulatory Visit: Payer: Self-pay

## 2019-05-04 ENCOUNTER — Encounter (HOSPITAL_BASED_OUTPATIENT_CLINIC_OR_DEPARTMENT_OTHER): Payer: Self-pay | Admitting: Anesthesiology

## 2019-05-04 ENCOUNTER — Ambulatory Visit
Admission: RE | Admit: 2019-05-04 | Discharge: 2019-05-04 | Disposition: A | Discharge status: Home / Self Care | Payer: BC Managed Care – PPO | Source: Ambulatory Visit | Attending: General Surgery | Admitting: General Surgery

## 2019-05-04 ENCOUNTER — Encounter (HOSPITAL_BASED_OUTPATIENT_CLINIC_OR_DEPARTMENT_OTHER)
Admission: RE | Disposition: A | Discharge status: Home / Self Care | Payer: Self-pay | Source: Home / Self Care | Attending: General Surgery

## 2019-05-04 DIAGNOSIS — Z803 Family history of malignant neoplasm of breast: Secondary | ICD-10-CM | POA: Diagnosis not present

## 2019-05-04 DIAGNOSIS — N6320 Unspecified lump in the left breast, unspecified quadrant: Secondary | ICD-10-CM

## 2019-05-04 DIAGNOSIS — E739 Lactose intolerance, unspecified: Secondary | ICD-10-CM | POA: Diagnosis not present

## 2019-05-04 DIAGNOSIS — N6324 Unspecified lump in the left breast, lower inner quadrant: Secondary | ICD-10-CM | POA: Diagnosis not present

## 2019-05-04 DIAGNOSIS — N6489 Other specified disorders of breast: Secondary | ICD-10-CM | POA: Diagnosis not present

## 2019-05-04 DIAGNOSIS — Z882 Allergy status to sulfonamides status: Secondary | ICD-10-CM | POA: Diagnosis not present

## 2019-05-04 DIAGNOSIS — Z8711 Personal history of peptic ulcer disease: Secondary | ICD-10-CM | POA: Diagnosis not present

## 2019-05-04 DIAGNOSIS — J302 Other seasonal allergic rhinitis: Secondary | ICD-10-CM | POA: Diagnosis not present

## 2019-05-04 DIAGNOSIS — D242 Benign neoplasm of left breast: Secondary | ICD-10-CM | POA: Diagnosis not present

## 2019-05-04 DIAGNOSIS — Z881 Allergy status to other antibiotic agents status: Secondary | ICD-10-CM | POA: Diagnosis not present

## 2019-05-04 DIAGNOSIS — K5190 Ulcerative colitis, unspecified, without complications: Secondary | ICD-10-CM | POA: Insufficient documentation

## 2019-05-04 DIAGNOSIS — R928 Other abnormal and inconclusive findings on diagnostic imaging of breast: Secondary | ICD-10-CM | POA: Diagnosis not present

## 2019-05-04 HISTORY — DX: Unspecified lump in the left breast, unspecified quadrant: N63.20

## 2019-05-04 HISTORY — PX: BREAST LUMPECTOMY WITH RADIOACTIVE SEED LOCALIZATION: SHX6424

## 2019-05-04 HISTORY — PX: BREAST EXCISIONAL BIOPSY: SUR124

## 2019-05-04 SURGERY — BREAST LUMPECTOMY WITH RADIOACTIVE SEED LOCALIZATION
Anesthesia: General | Site: Breast | Laterality: Left

## 2019-05-04 MED ORDER — MEPERIDINE HCL 25 MG/ML IJ SOLN: 6.2500 mg | INTRAMUSCULAR | Status: DC | PRN

## 2019-05-04 MED ORDER — HYDROCODONE-ACETAMINOPHEN 5-325 MG PO TABS: 1.0000 | ORAL_TABLET | Freq: Four times a day (QID) | ORAL | 0 refills | Status: DC | PRN

## 2019-05-04 MED ORDER — ACETAMINOPHEN 325 MG PO TABS
325.0000 mg | ORAL_TABLET | Freq: Once | ORAL | Status: AC | PRN
  Administered 2019-05-04: 11:00:00 650 mg via ORAL

## 2019-05-04 MED ORDER — LACTATED RINGERS IV SOLN: INTRAVENOUS | Status: DC

## 2019-05-04 MED ORDER — VANCOMYCIN HCL IN DEXTROSE 1-5 GM/200ML-% IV SOLN
1000.0000 mg | INTRAVENOUS | Status: AC
  Administered 2019-05-04: 1000 mg via INTRAVENOUS

## 2019-05-04 MED ORDER — DEXAMETHASONE SODIUM PHOSPHATE 10 MG/ML IJ SOLN
INTRAMUSCULAR | Status: AC
  Filled 2019-05-04: qty 1

## 2019-05-04 MED ORDER — SCOPOLAMINE 1 MG/3DAYS TD PT72
1.0000 | MEDICATED_PATCH | Freq: Once | TRANSDERMAL | Status: DC | PRN
  Administered 2019-05-04: 1.5 mg via TRANSDERMAL

## 2019-05-04 MED ORDER — ACETAMINOPHEN 160 MG/5ML PO SOLN: 325.0000 mg | Freq: Once | ORAL | Status: AC | PRN

## 2019-05-04 MED ORDER — VANCOMYCIN HCL IN DEXTROSE 1-5 GM/200ML-% IV SOLN
INTRAVENOUS | Status: AC
  Filled 2019-05-04: qty 200

## 2019-05-04 MED ORDER — ONDANSETRON HCL 4 MG/2ML IJ SOLN
INTRAMUSCULAR | Status: AC
  Filled 2019-05-04: qty 4

## 2019-05-04 MED ORDER — PROPOFOL 10 MG/ML IV BOLUS
INTRAVENOUS | Status: AC
  Filled 2019-05-04: qty 20

## 2019-05-04 MED ORDER — FENTANYL CITRATE (PF) 100 MCG/2ML IJ SOLN
INTRAMUSCULAR | Status: AC
  Filled 2019-05-04: qty 2

## 2019-05-04 MED ORDER — FENTANYL CITRATE (PF) 100 MCG/2ML IJ SOLN
INTRAMUSCULAR | Status: DC | PRN
  Administered 2019-05-04: 50 ug via INTRAVENOUS

## 2019-05-04 MED ORDER — SCOPOLAMINE 1 MG/3DAYS TD PT72
MEDICATED_PATCH | TRANSDERMAL | Status: AC
  Filled 2019-05-04: qty 1

## 2019-05-04 MED ORDER — BUPIVACAINE-EPINEPHRINE (PF) 0.5% -1:200000 IJ SOLN
INTRAMUSCULAR | Status: DC | PRN
  Administered 2019-05-04: 9 mL

## 2019-05-04 MED ORDER — PROMETHAZINE HCL 25 MG/ML IJ SOLN
INTRAMUSCULAR | Status: AC
  Filled 2019-05-04: qty 1

## 2019-05-04 MED ORDER — SODIUM CHLORIDE 0.9% FLUSH: 3.0000 mL | Freq: Two times a day (BID) | INTRAVENOUS | Status: DC

## 2019-05-04 MED ORDER — OXYCODONE HCL 5 MG/5ML PO SOLN: 5.0000 mg | Freq: Once | ORAL | Status: DC | PRN

## 2019-05-04 MED ORDER — PROMETHAZINE HCL 25 MG/ML IJ SOLN
6.2500 mg | INTRAMUSCULAR | Status: DC | PRN
  Administered 2019-05-04: 6.25 mg via INTRAVENOUS

## 2019-05-04 MED ORDER — GABAPENTIN 300 MG PO CAPS
ORAL_CAPSULE | ORAL | Status: AC
  Filled 2019-05-04: qty 1

## 2019-05-04 MED ORDER — MIDAZOLAM HCL 2 MG/2ML IJ SOLN
INTRAMUSCULAR | Status: AC
  Filled 2019-05-04: qty 2

## 2019-05-04 MED ORDER — DEXAMETHASONE SODIUM PHOSPHATE 10 MG/ML IJ SOLN
INTRAMUSCULAR | Status: DC | PRN
  Administered 2019-05-04: 10 mg via INTRAVENOUS

## 2019-05-04 MED ORDER — OXYCODONE HCL 5 MG PO TABS: 5.0000 mg | ORAL_TABLET | Freq: Once | ORAL | Status: DC | PRN

## 2019-05-04 MED ORDER — ACETAMINOPHEN 325 MG PO TABS
ORAL_TABLET | ORAL | Status: AC
  Filled 2019-05-04: qty 2

## 2019-05-04 MED ORDER — ACETAMINOPHEN 10 MG/ML IV SOLN: 1000.0000 mg | Freq: Once | INTRAVENOUS | Status: DC | PRN

## 2019-05-04 MED ORDER — PROPOFOL 10 MG/ML IV BOLUS
INTRAVENOUS | Status: DC | PRN
  Administered 2019-05-04: 200 mg via INTRAVENOUS

## 2019-05-04 MED ORDER — ONDANSETRON HCL 4 MG/2ML IJ SOLN
INTRAMUSCULAR | Status: DC | PRN
  Administered 2019-05-04 (×2): 4 mg via INTRAVENOUS

## 2019-05-04 MED ORDER — CHLORHEXIDINE GLUCONATE CLOTH 2 % EX PADS: 6.0000 | MEDICATED_PAD | Freq: Once | CUTANEOUS | Status: DC

## 2019-05-04 MED ORDER — LACTATED RINGERS IV SOLN
INTRAVENOUS | Status: DC
  Administered 2019-05-04 (×2): via INTRAVENOUS

## 2019-05-04 MED ORDER — MIDAZOLAM HCL 5 MG/5ML IJ SOLN
INTRAMUSCULAR | Status: DC | PRN
  Administered 2019-05-04: 2 mg via INTRAVENOUS

## 2019-05-04 MED ORDER — MIDAZOLAM HCL 2 MG/2ML IJ SOLN: 1.0000 mg | INTRAMUSCULAR | Status: DC | PRN

## 2019-05-04 MED ORDER — GABAPENTIN 300 MG PO CAPS
300.0000 mg | ORAL_CAPSULE | ORAL | Status: AC
  Administered 2019-05-04: 09:00:00 300 mg via ORAL

## 2019-05-04 MED ORDER — FENTANYL CITRATE (PF) 100 MCG/2ML IJ SOLN: 50.0000 ug | INTRAMUSCULAR | Status: DC | PRN

## 2019-05-04 MED ORDER — FENTANYL CITRATE (PF) 100 MCG/2ML IJ SOLN: 25.0000 ug | INTRAMUSCULAR | Status: DC | PRN

## 2019-05-04 MED ORDER — LIDOCAINE 2% (20 MG/ML) 5 ML SYRINGE
INTRAMUSCULAR | Status: DC | PRN
  Administered 2019-05-04: 50 mg via INTRAVENOUS

## 2019-05-04 SURGICAL SUPPLY — 68 items
ADH SKN CLS APL DERMABOND .7 (GAUZE/BANDAGES/DRESSINGS) ×1
APL PRP STRL LF DISP 70% ISPRP (MISCELLANEOUS) ×1
APL SKNCLS STERI-STRIP NONHPOA (GAUZE/BANDAGES/DRESSINGS)
APPLIER CLIP 9.375 MED OPEN (MISCELLANEOUS) ×3
APR CLP MED 9.3 20 MLT OPN (MISCELLANEOUS) ×1
BENZOIN TINCTURE PRP APPL 2/3 (GAUZE/BANDAGES/DRESSINGS) IMPLANT
BINDER BREAST LRG (GAUZE/BANDAGES/DRESSINGS) ×2 IMPLANT
BINDER BREAST MEDIUM (GAUZE/BANDAGES/DRESSINGS) IMPLANT
BINDER BREAST XLRG (GAUZE/BANDAGES/DRESSINGS) IMPLANT
BINDER BREAST XXLRG (GAUZE/BANDAGES/DRESSINGS) IMPLANT
BLADE HEX COATED 2.75 (ELECTRODE) ×3 IMPLANT
BLADE SURG 10 STRL SS (BLADE) IMPLANT
BLADE SURG 15 STRL LF DISP TIS (BLADE) ×1 IMPLANT
BLADE SURG 15 STRL SS (BLADE) ×3
CANISTER SUC SOCK COL 7IN (MISCELLANEOUS) IMPLANT
CANISTER SUCT 1200ML W/VALVE (MISCELLANEOUS) ×3 IMPLANT
CHLORAPREP W/TINT 26 (MISCELLANEOUS) ×3 IMPLANT
CLIP APPLIE 9.375 MED OPEN (MISCELLANEOUS) IMPLANT
CLOSURE WOUND 1/2 X4 (GAUZE/BANDAGES/DRESSINGS)
COVER BACK TABLE REUSABLE LG (DRAPES) ×3 IMPLANT
COVER MAYO STAND REUSABLE (DRAPES) ×3 IMPLANT
COVER PROBE W GEL 5X96 (DRAPES) ×3 IMPLANT
COVER WAND RF STERILE (DRAPES) IMPLANT
DECANTER SPIKE VIAL GLASS SM (MISCELLANEOUS) IMPLANT
DERMABOND ADVANCED (GAUZE/BANDAGES/DRESSINGS) ×2
DERMABOND ADVANCED .7 DNX12 (GAUZE/BANDAGES/DRESSINGS) ×1 IMPLANT
DRAPE LAPAROSCOPIC ABDOMINAL (DRAPES) ×3 IMPLANT
DRAPE UTILITY XL STRL (DRAPES) ×3 IMPLANT
DRSG PAD ABDOMINAL 8X10 ST (GAUZE/BANDAGES/DRESSINGS) ×3 IMPLANT
ELECT REM PT RETURN 9FT ADLT (ELECTROSURGICAL) ×3
ELECTRODE REM PT RTRN 9FT ADLT (ELECTROSURGICAL) ×1 IMPLANT
GAUZE SPONGE 4X4 12PLY STRL LF (GAUZE/BANDAGES/DRESSINGS) ×3 IMPLANT
GLOVE BIOGEL PI IND STRL 6.5 (GLOVE) IMPLANT
GLOVE BIOGEL PI INDICATOR 6.5 (GLOVE) ×4
GLOVE ECLIPSE 6.5 STRL STRAW (GLOVE) ×4 IMPLANT
GLOVE EUDERMIC 7 POWDERFREE (GLOVE) ×5 IMPLANT
GLOVE EXAM NITRILE MD LF STRL (GLOVE) ×2 IMPLANT
GOWN STRL REUS W/ TWL LRG LVL3 (GOWN DISPOSABLE) ×1 IMPLANT
GOWN STRL REUS W/ TWL XL LVL3 (GOWN DISPOSABLE) ×1 IMPLANT
GOWN STRL REUS W/TWL LRG LVL3 (GOWN DISPOSABLE) ×3
GOWN STRL REUS W/TWL XL LVL3 (GOWN DISPOSABLE) ×6
ILLUMINATOR WAVEGUIDE N/F (MISCELLANEOUS) IMPLANT
KIT MARKER MARGIN INK (KITS) ×3 IMPLANT
LIGHT WAVEGUIDE WIDE FLAT (MISCELLANEOUS) IMPLANT
NDL HYPO 25X1 1.5 SAFETY (NEEDLE) ×1 IMPLANT
NEEDLE HYPO 25X1 1.5 SAFETY (NEEDLE) ×3 IMPLANT
NS IRRIG 1000ML POUR BTL (IV SOLUTION) ×3 IMPLANT
PACK BASIN DAY SURGERY FS (CUSTOM PROCEDURE TRAY) ×3 IMPLANT
PENCIL BUTTON HOLSTER BLD 10FT (ELECTRODE) ×3 IMPLANT
SHEET MEDIUM DRAPE 40X70 STRL (DRAPES) IMPLANT
SLEEVE SCD COMPRESS KNEE MED (MISCELLANEOUS) ×3 IMPLANT
SPONGE LAP 18X18 RF (DISPOSABLE) IMPLANT
SPONGE LAP 4X18 RFD (DISPOSABLE) ×3 IMPLANT
STRIP CLOSURE SKIN 1/2X4 (GAUZE/BANDAGES/DRESSINGS) IMPLANT
SUT ETHILON 3 0 FSL (SUTURE) IMPLANT
SUT MNCRL AB 4-0 PS2 18 (SUTURE) ×3 IMPLANT
SUT SILK 2 0 SH (SUTURE) ×3 IMPLANT
SUT VIC AB 2-0 CT1 27 (SUTURE)
SUT VIC AB 2-0 CT1 TAPERPNT 27 (SUTURE) IMPLANT
SUT VIC AB 3-0 SH 27 (SUTURE)
SUT VIC AB 3-0 SH 27X BRD (SUTURE) IMPLANT
SUT VICRYL 3-0 CR8 SH (SUTURE) ×3 IMPLANT
SYR 10ML LL (SYRINGE) ×3 IMPLANT
TOWEL GREEN STERILE FF (TOWEL DISPOSABLE) ×3 IMPLANT
TRAY FAXITRON CT DISP (TRAY / TRAY PROCEDURE) ×3 IMPLANT
TUBE CONNECTING 20'X1/4 (TUBING) ×1
TUBE CONNECTING 20X1/4 (TUBING) ×2 IMPLANT
YANKAUER SUCT BULB TIP NO VENT (SUCTIONS) ×3 IMPLANT

## 2019-05-04 NOTE — Op Note (Signed)
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 Dalbert Batman, M.D., FACS  Assistant:                      Or staff  Operative Indications:    [name] Location: [facility] Patient #: [id number] DOB: 1976/10/12 Married / Language: English / Race: White Female      History of Present Illness      . This is a very pleasant 43 year old female, referred by [name] for evaluation of enlarging left breast mass, lower inner quadrant. [name], NP provides primary care.     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 A density was found in the left breast lower inner quadrant on mammogram 1-2 years ago. She says she can now feel this. No nipple discharge or abnormality there. No prior breast surgery She says it is enlarging. She says it is a little bit painful. 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 This was thought to be a 2.5 x 0.9 x 1.9 cm mass in the left breast, 8 o'clock position, 4 cm from nipple ultrasound was performed on Apr 12, 2019 and it measures a few millimeters larger according to [name]. Although this is low risk histologically it is enlarging, is a solitary mass, is a little bit painful and is causing increasing anxiety. She requested surgical referral      Past history is significant for mild ulcerative colitis followed by Dr. [name]. Does not take any steroids or strong immune modulators. Surgery for urethral cyst. Basically healthy. Family history reveals a great aunt had breast cancer. Mother living had sinus cancer and hypertension. Father has hypertension. A maternal grandmother had  colon cancer. No ovarian cancer       On exam I can feel slightly irregular rubbery mobile mass at the 8 o'clock position of the left breast, 3-4 cm from the areolar margin. Feels more like a fibroadenoma than anything. No other abnormalities      She requested this area be excised. I think that is reasonable She'll be scheduled for left breast lumpectomy with radioactive seed localization.  She agrees with this plan.   Operative Findings:       There was a bilobed smooth rubbery mass consistent with a fibroadenoma.  The radioactive seed was present on the anterior lateral surface of this.  I put silk sutures to mark the margins.  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  Surgical timeout was performed.  Intravenous antibiotics were given.  0.5% Marcaine with epinephrine was used as local infiltration anesthetic.  Using the neoprobe I isolated the radioactive seed which appeared to be at the medial aspect  of the palpable mass.  A short transverse incision was made, and very conservative excision of this mass was performed.  Silk sutures were placed to orient the pathologist.  Specimen mammogram looked good and contained the radioactive seed.  Specimen was sent to the lab where the seed was retrieved.  The wound was irrigated.  Hemostasis excellent.  Breast tissues were closed with interrupted 3-0 Vicryl and the skin closed with a running subcuticular 4-0 Monocryl and Dermabond.  Breast binder was placed and the patient taken to PACU in stable condition.  EBL 10 cc or less.  Counts correct.  Complications none.   Addendum: I logged onto the PMP aware website and reviewed her prescription medication history     Gloria Fox M. Dalbert Batman, M.D., FACS General and Minimally Invasive Surgery Breast and Colorectal Surgery  05/04/2019 10:19 AM

## 2019-05-04 NOTE — Anesthesia Postprocedure Evaluation (Signed)
Anesthesia Post Note  Patient: Gloria Fox  Procedure(s) Performed: LEFT BREAST LUMPECTOMY WITH RADIOACTIVE SEED LOCALIZATION (Left Breast)     Patient location during evaluation: PACU Anesthesia Type: General Level of consciousness: awake and alert Pain management: pain level controlled Vital Signs Assessment: post-procedure vital signs reviewed and stable Respiratory status: spontaneous breathing, nonlabored ventilation, respiratory function stable and patient connected to nasal cannula oxygen Cardiovascular status: blood pressure returned to baseline and stable Postop Assessment: no apparent nausea or vomiting Anesthetic complications: no    Last Vitals:  Vitals:   05/04/19 1100 05/04/19 1130  BP: 107/72 113/75  Pulse: 73 71  Resp: 15 16  Temp:  36.6 C  SpO2: 100% 100%    Last Pain:  Vitals:   05/04/19 1130  TempSrc:   PainSc: 2                  Effie Berkshire

## 2019-05-04 NOTE — Discharge Instructions (Signed)
Next dose Tylenol at [place] Office Phone Number [phone]  BREAST BIOPSY/ PARTIAL MASTECTOMY: POST OP INSTRUCTIONS  Always review your discharge instruction sheet given to you by the facility where your surgery was performed.  IF YOU HAVE DISABILITY OR FAMILY LEAVE FORMS, YOU MUST BRING THEM TO THE OFFICE FOR PROCESSING.  DO NOT GIVE THEM TO YOUR DOCTOR.  1. A prescription for pain medication may be given to you upon discharge.  Take your pain medication as prescribed, if needed.  If narcotic pain medicine is not needed, then you may take acetaminophen (Tylenol) or ibuprofen (Advil) as needed. 2. Take your usually prescribed medications unless otherwise directed 3. If you need a refill on your pain medication, please contact your pharmacy.  They will contact our office to request authorization.  Prescriptions will not be filled after 5pm or on week-ends. 4. You should eat very light the first 24 hours after surgery, such as soup, crackers, pudding, etc.  Resume your normal diet the day after surgery. 5. Most patients will experience some swelling and bruising in the breast.  Ice packs and a good support bra will help.  Swelling and bruising can take several days to resolve.  6. It is common to experience some constipation if taking pain medication after surgery.  Increasing fluid intake and taking a stool softener will usually help or prevent this problem from occurring.  A mild laxative (Milk of Magnesia or Miralax) should be taken according to package directions if there are no bowel movements after 48 hours. 7. Unless discharge instructions indicate otherwise, you may remove your bandages 24-48 hours after surgery, and you may shower at that time.  You may have steri-strips (small skin tapes) in place directly over the incision.  These strips should be left on the skin for 7-10 days.  If your surgeon used skin glue on the incision, you may shower in 24 hours.  The  glue will flake off over the next 2-3 weeks.  Any sutures or staples will be removed at the office during your follow-up visit. 8. ACTIVITIES:  You may resume regular daily activities (gradually increasing) beginning the next day.  Wearing a good support bra or sports bra minimizes pain and swelling.  You may have sexual intercourse when it is comfortable. a. You may drive when you no longer are taking prescription pain medication, you can comfortably wear a seatbelt, and you can safely maneuver your car and apply brakes. b. RETURN TO WORK:  ______________________________________________________________________________________ 9. You should see your doctor in the office for a follow-up appointment approximately two weeks after your surgery.  Your doctors nurse will typically make your follow-up appointment when she calls you with your pathology report.  Expect your pathology report 2-3 business days after your surgery.  You may call to check if you do not hear from [place] after three days. 10. OTHER INSTRUCTIONS: _______________________________________________________________________________________________ _____________________________________________________________________________________________________________________________________ _____________________________________________________________________________________________________________________________________ _____________________________________________________________________________________________________________________________________  WHEN TO CALL YOUR DOCTOR: 1. Fever over 101.0 2. Nausea and/or vomiting. 3. Extreme swelling or bruising. 4. Continued bleeding from incision. 5. Increased pain, redness, or drainage from the incision.  The clinic staff is available to answer your questions during regular business hours.  Please dont hesitate to call and ask to speak to one of the nurses for clinical concerns.  If you have a medical  emergency, go to the nearest emergency room or call 911.  A surgeon from [facility] is always on call at the hospital.  For further questions, please visit [email or web address]               Managing Your Pain After Surgery Without Opioids    Thank you for participating in our program to help patients manage their pain after surgery without opioids. This is part of our effort to provide you with the best care possible, without exposing you or your family to the risk that opioids pose.  What pain can I expect after surgery? You can expect to have some pain after surgery. This is normal. The pain is typically worse the day after surgery, and quickly begins to get better. Many studies have found that many patients are able to manage their pain after surgery with Over-the-Counter (OTC) medications such as Tylenol and Motrin. If you have a condition that does not allow you to take Tylenol or Motrin, notify your surgical team.  How will I manage my pain? The best strategy for controlling your pain after surgery is around the clock pain control with Tylenol (acetaminophen) and Motrin (ibuprofen or Advil). Alternating these medications with each other allows you to maximize your pain control. In addition to Tylenol and Motrin, you can use heating pads or ice packs on your incisions to help reduce your pain.  How will I alternate your regular strength over-the-counter pain medication? You will take a dose of pain medication every three hours. ; Start by taking 650 mg of Tylenol (2 pills of 325 mg) ; 3 hours later take 600 mg of Motrin (3 pills of 200 mg) ; 3 hours after taking the Motrin take 650 mg of Tylenol ; 3 hours after that take 600 mg of Motrin.   - 1 -  See example - if your first dose of Tylenol is at 12:00 PM   12:00 PM Tylenol 650 mg (2 pills of 325 mg)  3:00 PM Motrin 600 mg (3 pills of 200 mg)  6:00 PM Tylenol 650 mg (2 pills of 325 mg)    9:00 PM Motrin 600 mg (3 pills of 200 mg)  Continue alternating every 3 hours   We recommend that you follow this schedule around-the-clock for at least 3 days after surgery, or until you feel that it is no longer needed. Use the table on the last page of this handout to keep track of the medications you are taking. Important: Do not take more than 3010m of Tylenol or 32046mof Motrin in a 24-hour period. Do not take ibuprofen/Motrin if you have a history of bleeding stomach ulcers, severe kidney disease, &/or actively taking a blood thinner  What if I still have pain? If you have pain that is not controlled with the over-the-counter pain medications (Tylenol and Motrin or Advil) you might have what we call breakthrough pain. You will receive a prescription for a small amount of an opioid pain medication such as Oxycodone, Tramadol, or Tylenol with Codeine. Use these opioid pills in the first 24 hours after surgery if you have breakthrough pain. Do not take more than 1 pill every 4-6 hours.  If you still have uncontrolled pain after using all opioid pills, don't hesitate to call our staff using the number provided. We will help make sure you are managing your pain in the best way possible, and if necessary, we can provide a prescription for additional pain medication.   Day 1    Time  Name of Medication Number of pills taken  Amount of Acetaminophen  Pain Level   Comments  AM PM       AM PM       AM PM       AM PM       AM PM       AM PM       AM PM       AM PM       Total Daily amount of Acetaminophen Do not take more than  3,000 mg per day      Day 2    Time  Name of Medication Number of pills taken  Amount of Acetaminophen  Pain Level   Comments  AM PM       AM PM       AM PM       AM PM       AM PM       AM PM       AM PM       AM PM       Total Daily amount of Acetaminophen Do not take more than  3,000 mg per day      Day 3    Time  Name of  Medication Number of pills taken  Amount of Acetaminophen  Pain Level   Comments  AM PM       AM PM       AM PM       AM PM          AM PM       AM PM       AM PM       AM PM       Total Daily amount of Acetaminophen Do not take more than  3,000 mg per day      Day 4    Time  Name of Medication Number of pills taken  Amount of Acetaminophen  Pain Level   Comments  AM PM       AM PM       AM PM       AM PM       AM PM       AM PM       AM PM       AM PM       Total Daily amount of Acetaminophen Do not take more than  3,000 mg per day      Day 5    Time  Name of Medication Number of pills taken  Amount of Acetaminophen  Pain Level   Comments  AM PM       AM PM       AM PM       AM PM       AM PM       AM PM       AM PM       AM PM       Total Daily amount of Acetaminophen Do not take more than  3,000 mg per day       Day 6    Time  Name of Medication Number of pills taken  Amount of Acetaminophen  Pain Level  Comments  AM PM       AM PM       AM PM       AM PM       AM PM       AM PM       AM PM  AM PM       Total Daily amount of Acetaminophen Do not take more than  3,000 mg per day      Day 7    Time  Name of Medication Number of pills taken  Amount of Acetaminophen  Pain Level   Comments  AM PM       AM PM       AM PM       AM PM       AM PM       AM PM       AM PM       AM PM       Total Daily amount of Acetaminophen Do not take more than  3,000 mg per day        For additional information about how and where to safely dispose of unused opioid medications - [employer]  Disclaimer: This document contains information and/or instructional materials adapted from [place] for the typical patient with your condition. It does not replace medical advice from your health care provider because your experience may differ from that of the typical patient. Talk to your health care  provider if you have any questions about this document, your condition or your treatment plan. Adapted from [place] Instructions  Activity: Get plenty of rest for the remainder of the day. A responsible individual must stay with you for 24 hours following the procedure.  For the next 24 hours, DO NOT: -Drive a car -Paediatric nurse -Drink alcoholic beverages -Take any medication unless instructed by your physician -Make any legal decisions or sign important papers.  Meals: Start with liquid foods such as gelatin or soup. Progress to regular foods as tolerated. Avoid greasy, spicy, heavy foods. If nausea and/or vomiting occur, drink only clear liquids until the nausea and/or vomiting subsides. Call your physician if vomiting continues.  Special Instructions/Symptoms: Your throat may feel dry or sore from the anesthesia or the breathing tube placed in your throat during surgery. If this causes discomfort, gargle with warm salt water. The discomfort should disappear within 24 hours.  If you had a scopolamine patch placed behind your ear for the management of post- operative nausea and/or vomiting:  1. The medication in the patch is effective for 72 hours, after which it should be removed.  Wrap patch in a tissue and discard in the trash. Wash hands thoroughly with soap and water. 2. You may remove the patch earlier than 72 hours if you experience unpleasant side effects which may include dry mouth, dizziness or visual disturbances. 3. Avoid touching the patch. Wash your hands with soap and water after contact with the patch.

## 2019-05-04 NOTE — Anesthesia Procedure Notes (Signed)
Procedure Name: LMA Insertion Date/Time: 05/04/2019 9:45 AM Performed by: Gwyndolyn Saxon, CRNA Pre-anesthesia Checklist: Patient identified, Emergency Drugs available, Suction available and Patient being monitored Patient Re-evaluated:Patient Re-evaluated prior to induction Oxygen Delivery Method: Circle system utilized Preoxygenation: Pre-oxygenation with 100% oxygen Induction Type: IV induction Ventilation: Mask ventilation without difficulty LMA: LMA inserted LMA Size: 3.0 Number of attempts: 1 Placement Confirmation: positive ETCO2 and breath sounds checked- equal and bilateral Tube secured with: Tape Dental Injury: Teeth and Oropharynx as per pre-operative assessment

## 2019-05-04 NOTE — Transfer of Care (Signed)
Immediate Anesthesia Transfer of Care Note  Patient: Gloria Fox  Procedure(s) Performed: LEFT BREAST LUMPECTOMY WITH RADIOACTIVE SEED LOCALIZATION (Left Breast)  Patient Location: PACU  Anesthesia Type:General  Level of Consciousness: drowsy  Airway & Oxygen Therapy: Patient Spontanous Breathing and Patient connected to nasal cannula oxygen  Post-op Assessment: Report given to RN and Post -op Vital signs reviewed and stable  Post vital signs: Reviewed and stable  Last Vitals:  Vitals Value Taken Time  BP 98/65 05/04/19 1024  Temp    Pulse 60 05/04/19 1028  Resp 12 05/04/19 1028  SpO2 100 % 05/04/19 1028  Vitals shown include unvalidated device data.  Last Pain:  Vitals:   05/04/19 0840  TempSrc: Oral  PainSc: 0-No pain         Complications: No apparent anesthesia complications

## 2019-05-04 NOTE — Interval H&P Note (Signed)
History and Physical Interval Note:  05/04/2019 9:10 AM  Gloria Fox  has presented today for surgery, with the diagnosis of LEFT BREAST MASS/PASH.  The various methods of treatment have been discussed with the patient and family. After consideration of risks, benefits and other options for treatment, the patient has consented to  Procedure(s): LEFT BREAST LUMPECTOMY WITH RADIOACTIVE SEED LOCALIZATION (Left) as a surgical intervention.  The patient's history has been reviewed, patient examined, no change in status, stable for surgery.  I have reviewed the patient's chart and labs.  Questions were answered to the patient's satisfaction.     [name]

## 2019-05-04 NOTE — Anesthesia Preprocedure Evaluation (Addendum)
Anesthesia Evaluation  Patient identified by MRN, date of birth, ID band Patient awake    Reviewed: Allergy & Precautions, NPO status , Patient's Chart, lab work & pertinent test results  Airway Mallampati: I  TM Distance: >3 FB Neck ROM: Full    Dental  (+) Teeth Intact, Dental Advisory Given   Pulmonary neg pulmonary ROS,    breath sounds clear to auscultation       Cardiovascular negative cardio ROS   Rhythm:Regular Rate:Normal     Neuro/Psych negative neurological ROS     GI/Hepatic Neg liver ROS, PUD,   Endo/Other  negative endocrine ROS  Renal/GU negative Renal ROS     Musculoskeletal negative musculoskeletal ROS (+)   Abdominal Normal abdominal exam  (+)   Peds  Hematology negative hematology ROS (+)   Anesthesia Other Findings   Reproductive/Obstetrics                            Anesthesia Physical Anesthesia Plan  ASA: II  Anesthesia Plan: General   Post-op Pain Management:    Induction: Intravenous  PONV Risk Score and Plan: 4 or greater and Ondansetron, Dexamethasone, Midazolam and Scopolamine patch - Pre-op  Airway Management Planned: LMA  Additional Equipment: None  Intra-op Plan:   Post-operative Plan: Extubation in OR  Informed Consent: I have reviewed the patients History and Physical, chart, labs and discussed the procedure including the risks, benefits and alternatives for the proposed anesthesia with the patient or authorized representative who has indicated his/her understanding and acceptance.     Dental advisory given  Plan Discussed with: CRNA  Anesthesia Plan Comments:        Anesthesia Quick Evaluation

## 2019-05-07 ENCOUNTER — Encounter (HOSPITAL_BASED_OUTPATIENT_CLINIC_OR_DEPARTMENT_OTHER): Payer: Self-pay | Admitting: General Surgery

## 2019-05-07 NOTE — Progress Notes (Signed)
Inform patient of Pathology report,. Breast pathology shows fibroadenoma and PASH.  Completely benign. I will discuss inb setail at post op OV. Let me know that you reached her. Thanks. Dalbert Batman

## 2019-08-29 ENCOUNTER — Other Ambulatory Visit: Payer: Self-pay | Admitting: Internal Medicine

## 2019-10-03 ENCOUNTER — Other Ambulatory Visit: Payer: Self-pay

## 2019-10-03 ENCOUNTER — Encounter: Payer: Self-pay | Admitting: Family

## 2019-10-03 ENCOUNTER — Ambulatory Visit (INDEPENDENT_AMBULATORY_CARE_PROVIDER_SITE_OTHER): Payer: BC Managed Care – PPO | Admitting: Family

## 2019-10-03 VITALS — BP 105/77 | HR 92 | Wt 130.0 lb

## 2019-10-03 DIAGNOSIS — Z0000 Encounter for general adult medical examination without abnormal findings: Secondary | ICD-10-CM

## 2019-10-03 DIAGNOSIS — G43909 Migraine, unspecified, not intractable, without status migrainosus: Secondary | ICD-10-CM | POA: Diagnosis not present

## 2019-10-03 MED ORDER — LEVOCETIRIZINE DIHYDROCHLORIDE 5 MG PO TABS: 5.0000 mg | ORAL_TABLET | Freq: Every evening | ORAL | 11 refills | Status: DC

## 2019-10-03 MED ORDER — TRIAMCINOLONE ACETONIDE 55 MCG/ACT NA AERO: 2.0000 | INHALATION_SPRAY | Freq: Every day | NASAL | 12 refills | Status: DC

## 2019-10-03 MED ORDER — SUMATRIPTAN SUCCINATE 50 MG PO TABS: ORAL_TABLET | ORAL | 5 refills | Status: DC

## 2019-10-03 NOTE — Progress Notes (Signed)
Virtual Visit via Video Note  I connected with Gloria Fox on 10/03/19 at  7:00 AM EST by a video enabled telemedicine application and verified that I am speaking with the correct person using two identifiers.  Location: Patient: home Provider: home   I discussed the limitations of evaluation and management by telemedicine and the availability of in person appointments. The patient expressed understanding and agreed to proceed.  History of Present Illness:  Immunizations: tetanus 2017, flu up to date Diet: healthy Exercise:  Walks the dog every morning Pap Smear: 09/15/16- will schedule with GYN Mammogram: 6/20 Vision: up to date Dental:  Up to date  Migraines- see below.  Reports that last time she had a migraine tylenol did not really help her symptoms. She has started xyzal and nasocort which is helpful.   ROS  Gen: weight stable.  130 lb Cv: denies cp/sob Ent: denies nasal congestion Resp: denies cough Gu: denies dysuria, frequency, hematuria. GI: denies constipation/diarrhea GYN:  Normal periods Neuro: reports + migraines, thinks she had an aura a few months back.  Having about 1 migraine every 2 months.   Psych: denies depression/anxiety     Past Medical History:  Diagnosis Date  . Allergy    SEASONAL  . Anemia   . Colitis 01/12/12   left colon  . History of bursitis    right shoulder  . Lactose intolerance 07/03/2018   By hx  . Left breast mass 05/04/2019  . Pseudoangiomatous stromal hyperplasia of breast    left  . Sessile rectal polyp 01/12/12     Social History   Socioeconomic History  . Marital status: Married    Spouse name: Not on file  . Number of children: 2  . Years of education: Not on file  . Highest education level: Not on file  Occupational History  . Occupation: Nurse  Social Needs  . Financial resource strain: Not on file  . Food insecurity    Worry: Not on file    Inability: Not on file  . Transportation needs   Medical: Not on file    Non-medical: Not on file  Tobacco Use  . Smoking status: Never Smoker  . Smokeless tobacco: Never Used  Substance and Sexual Activity  . Alcohol use: No  . Drug use: No  . Sexual activity: Yes    Birth control/protection: Other-see comments    Comment: Vasectomy  Lifestyle  . Physical activity    Days per week: Not on file    Minutes per session: Not on file  . Stress: Not on file  Relationships  . Social Herbalist on phone: Not on file    Gets together: Not on file    Attends religious service: Not on file    Active member of club or organization: Not on file    Attends meetings of clubs or organizations: Not on file    Relationship status: Not on file  . Intimate partner violence    Fear of current or ex partner: Not on file    Emotionally abused: Not on file    Physically abused: Not on file    Forced sexual activity: Not on file  Other Topics Concern  . Not on file  Social History Narrative   Married   2 sons- 1999 and 2001 - one in college one doing Chartered certified accountant and apprenticeship - Automotive engineer - [employer]   Enjoys spending time with family, beach, [place].  Never smoker/tobacco, no drugs, EtOH    Past Surgical History:  Procedure Laterality Date  . BREAST LUMPECTOMY WITH RADIOACTIVE SEED LOCALIZATION Left 05/04/2019   Procedure: LEFT BREAST LUMPECTOMY WITH RADIOACTIVE SEED LOCALIZATION;  Surgeon: Fanny Skates, MD;  Location: [facility];  Service: General;  Laterality: Left;  . COLONOSCOPY  01/12/2012   repeat 2019 - Left UC  . CYSTECTOMY     reports hx of urethral cyst  . URETHRAL CYST REMOVAL      Family History  Problem Relation Age of Onset  . Hypertension Mother   . Cancer Mother        sinus cancer  . Hypertension Father   . Hypertension Sister   . Colon cancer Maternal Grandmother 5  . Dementia Maternal Grandmother   . Alcohol abuse Maternal Grandfather   . Alcohol abuse Paternal  Grandmother     Allergies  Allergen Reactions  . Cephalexin Rash  . Sulfa Drugs Cross Reactors Rash    Current Outpatient Medications on File Prior to Visit  Medication Sig Dispense Refill  . HYDROcodone-acetaminophen (NORCO) 5-325 MG tablet Take 1-2 tablets by mouth every 6 (six) hours as needed for moderate pain or severe pain. 20 tablet 0  . hyoscyamine (LEVSIN SL) 0.125 MG SL tablet DISSOLVE 1 TABLET UNDER THE SKIN EVERY 4 HOURS AS NEEDED 90 tablet 0  . mesalamine (CANASA) 1000 MG suppository INSERT 1 SUPPOSITORY RECTALLY AT BEDTIME 30 suppository 0  . mesalamine (LIALDA) 1.2 g EC tablet TAKE 2 TABLETS TWICE A DAY 120 tablet 0   No current facility-administered medications on file prior to visit.     BP 105/77   Pulse 92   Wt 130 lb (59 kg)   BMI 23.78 kg/m    Observations/Objective:   Gen: Awake, alert, no acute distress Resp: Breathing is even and non-labored Psych: calm/pleasant demeanor Neuro: Alert and Oriented x 3, + facial symmetry, speech is clear.   Assessment and Plan:  Preventative care- encouraged pt to continue healthy diet and regular exercise. She will schedule pap smear with her GYN.   She will complete lab work next week at your [place] location. Immunizations reviewed and up to date. Mammogram up to date.   Migraines- will give trial of imitrex prn.  She is advised to let me know in a few months how she is doing and if the imitrex is helpful.   Follow Up Instructions:    I discussed the assessment and treatment plan with the patient. The patient was provided an opportunity to ask questions and all were answered. The patient agreed with the plan and demonstrated an understanding of the instructions.   The patient was advised to call back or seek an in-person evaluation if the symptoms worsen or if the condition fails to improve as anticipated.  Nance Pear, NP

## 2019-10-23 ENCOUNTER — Other Ambulatory Visit: Payer: Self-pay | Admitting: Family

## 2019-10-23 DIAGNOSIS — Z1231 Encounter for screening mammogram for malignant neoplasm of breast: Secondary | ICD-10-CM

## 2019-10-30 ENCOUNTER — Ambulatory Visit: Payer: BC Managed Care – PPO | Admitting: Internal Medicine

## 2019-11-08 ENCOUNTER — Other Ambulatory Visit: Payer: Self-pay

## 2019-11-08 ENCOUNTER — Ambulatory Visit (INDEPENDENT_AMBULATORY_CARE_PROVIDER_SITE_OTHER): Payer: BC Managed Care – PPO | Admitting: Internal Medicine

## 2019-11-08 ENCOUNTER — Other Ambulatory Visit (INDEPENDENT_AMBULATORY_CARE_PROVIDER_SITE_OTHER): Payer: BC Managed Care – PPO

## 2019-11-08 ENCOUNTER — Encounter: Payer: Self-pay | Admitting: Internal Medicine

## 2019-11-08 DIAGNOSIS — Z0000 Encounter for general adult medical examination without abnormal findings: Secondary | ICD-10-CM

## 2019-11-08 DIAGNOSIS — K5150 Left sided colitis without complications: Secondary | ICD-10-CM

## 2019-11-08 LAB — CBC WITH DIFFERENTIAL/PLATELET
Basophils Absolute: 0.1 10*3/uL (ref 0.0–0.1)
Basophils Relative: 0.7 % (ref 0.0–3.0)
Eosinophils Absolute: 0.3 10*3/uL (ref 0.0–0.7)
Eosinophils Relative: 3.5 % (ref 0.0–5.0)
HCT: 43.8 % (ref 36.0–46.0)
Hemoglobin: 14.8 g/dL (ref 12.0–15.0)
Lymphocytes Relative: 20.1 % (ref 12.0–46.0)
Lymphs Abs: 1.7 10*3/uL (ref 0.7–4.0)
MCHC: 33.8 g/dL (ref 30.0–36.0)
MCV: 91.7 fl (ref 78.0–100.0)
Monocytes Absolute: 0.7 10*3/uL (ref 0.1–1.0)
Monocytes Relative: 8.8 % (ref 3.0–12.0)
Neutro Abs: 5.6 10*3/uL (ref 1.4–7.7)
Neutrophils Relative %: 66.9 % (ref 43.0–77.0)
Platelets: 224 10*3/uL (ref 150.0–400.0)
RBC: 4.77 Mil/uL (ref 3.87–5.11)
RDW: 14.1 % (ref 11.5–15.5)
WBC: 8.4 10*3/uL (ref 4.0–10.5)

## 2019-11-08 LAB — LIPID PANEL
Cholesterol: 147 mg/dL (ref 0–200)
HDL: 36.8 mg/dL — ABNORMAL LOW (ref >39.00)
LDL Cholesterol: 93 mg/dL (ref 0–99)
NonHDL: 109.79
Total CHOL/HDL Ratio: 4
Triglycerides: 83 mg/dL (ref 0.0–149.0)
VLDL: 16.6 mg/dL (ref 0.0–40.0)

## 2019-11-08 LAB — BASIC METABOLIC PANEL
BUN: 16 mg/dL (ref 6–23)
CO2: 24 mEq/L (ref 19–32)
Calcium: 9.1 mg/dL (ref 8.4–10.5)
Chloride: 107 mEq/L (ref 96–112)
Creatinine, Ser: 0.79 mg/dL (ref 0.40–1.20)
GFR: 79.33 mL/min (ref >60.00)
Glucose, Bld: 87 mg/dL (ref 70–99)
Potassium: 4.1 mEq/L (ref 3.5–5.1)
Sodium: 139 mEq/L (ref 135–145)

## 2019-11-08 LAB — HEPATIC FUNCTION PANEL
ALT: 9 U/L (ref 0–35)
AST: 12 U/L (ref 0–37)
Albumin: 4.2 g/dL (ref 3.5–5.2)
Alkaline Phosphatase: 64 U/L (ref 39–117)
Bilirubin, Direct: 0 mg/dL (ref 0.0–0.3)
Total Bilirubin: 0.4 mg/dL (ref 0.2–1.2)
Total Protein: 7 g/dL (ref 6.0–8.3)

## 2019-11-08 LAB — TSH: TSH: 1.55 u[IU]/mL (ref 0.35–4.50)

## 2019-11-08 MED ORDER — MESALAMINE 1000 MG RE SUPP: RECTAL | 3 refills | Status: DC

## 2019-11-08 MED ORDER — MESALAMINE 1.2 G PO TBEC: 2.4000 g | DELAYED_RELEASE_TABLET | Freq: Two times a day (BID) | ORAL | 3 refills | Status: DC

## 2019-11-08 MED ORDER — HYOSCYAMINE SULFATE 0.125 MG SL SUBL: SUBLINGUAL_TABLET | SUBLINGUAL | 3 refills | Status: DC

## 2019-11-08 NOTE — Patient Instructions (Signed)
We have sent the following prescriptions to your mail in pharmacy:  Levsin, Lialda and canasa suppositories  If you have not heard from your mail in pharmacy within 1 week or if you have not received your medication in the mail, please contact us at [phone] so we may find out why.    I appreciate the opportunity to care for you. Silvano Rusk, MD, [facility]

## 2019-11-08 NOTE — Assessment & Plan Note (Addendum)
Doing well, continue current medication regimen with Lialda and Canasa.  She believes the Canasa suppository it makes a big difference.  She has been able to avoid Biologics. Return in a year or sooner as needed Regarding the Covid vaccination I suspect she would be okay as the increased risk of reactions seems related to people on or having an EpiPen with history of severe allergic reactions as opposed to autoimmune diseases but the answer is we really do not know at this point that is a personal decision whether to take the vaccine.

## 2019-11-08 NOTE — Progress Notes (Signed)
Mairead Schwarzkopf 43 y.o. 24-Nov-1975 [id number]  Assessment & Plan:  Left sided ulcerative colitis ([place]) Doing well, continue current medication regimen with Lialda and Canasa.  She believes the Canasa suppository it makes a big difference.  She has been able to avoid Biologics. Return in a year or sooner as needed Regarding the Covid vaccination I suspect she would be okay as the increased risk of reactions seems related to people on or having an EpiPen with history of severe allergic reactions as opposed to autoimmune diseases but the answer is we really do not know at this point that is a personal decision whether to take the vaccine.   I appreciate the opportunity to care for this patient. CC: Debbrah Alar, NP    Subjective:   Chief Complaint: Follow-up of left-sided ulcerative colitis  HPI Bernetha is present today for follow-up, she was seen in February at that point she successfully tapered off prednisone we were thinking she would need Biologics but the addition of a Canasa suppository nightly her most nights is made a big difference and she is continued on the regimen of Lialda and Canasa and is without problems.  She raises a question about whether she should take the Covid vaccine because she has no autoimmune disease.  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  Wt Readings from Last 3 Encounters:  11/08/19 130 lb (59 kg)  10/03/19 130 lb (59 kg)  05/04/19 130 lb 11.7 oz (59.3 kg)   She is following appropriate precautions with masking etc. and social distancing. Allergies  Allergen Reactions  . Cephalexin Rash  . Sulfa Drugs Cross Reactors Rash   Current Meds  Medication Sig  . hyoscyamine (LEVSIN SL) 0.125 MG SL tablet DISSOLVE 1 TABLET UNDER THE SKIN EVERY 4 HOURS AS NEEDED  . levocetirizine (XYZAL) 5 MG tablet Take 1 tablet (5 mg total) by mouth every evening.  . mesalamine (CANASA)  1000 MG suppository INSERT 1 SUPPOSITORY RECTALLY AT BEDTIME  . mesalamine (LIALDA) 1.2 g EC tablet TAKE 2 TABLETS TWICE A DAY  . SUMAtriptan (IMITREX) 50 MG tablet Take 1 tablet at the start of the migraine, may repeat in 2 hours as needed (max 2 tabs/24 hrs)  . triamcinolone (NASACORT) 55 MCG/ACT AERO nasal inhaler Place 2 sprays into the nose daily.   Past Medical History:  Diagnosis Date  . Allergy    SEASONAL  . Anemia   . Colitis 01/12/12   left colon  . History of bursitis    right shoulder  . Lactose intolerance 07/03/2018   By hx  . Left breast mass 05/04/2019  . Pseudoangiomatous stromal hyperplasia of breast    left  . Sessile rectal polyp 01/12/12   Past Surgical History:  Procedure Laterality Date  . BREAST LUMPECTOMY WITH RADIOACTIVE SEED LOCALIZATION Left 05/04/2019   Procedure: LEFT BREAST LUMPECTOMY WITH RADIOACTIVE SEED LOCALIZATION;  Surgeon: Fanny Skates, MD;  Location: [facility];  Service: General;  Laterality: Left;  . COLONOSCOPY  01/12/2012   repeat 2019 - Left UC  . CYSTECTOMY     reports hx of urethral cyst  . URETHRAL CYST REMOVAL     Social History   Social History Narrative   Married   2 sons- 1999 and 2001 - one in college one doing Chartered certified accountant and apprenticeship - Automotive engineer - [place]   Enjoys spending  time with family, beach, mountains.    Never smoker/tobacco, no drugs, EtOH   family history includes Alcohol abuse in her maternal grandfather and paternal grandmother; Cancer in her mother; Colon cancer (age of onset: 10) in her maternal grandmother; Dementia in her maternal grandmother; Hypertension in her father, mother, and sister.   Review of Systems As above  Objective:   Physical Exam BP 110/66   Pulse 88   Temp 97.8 F (36.6 C) (Oral)   Ht 5' 2"  (1.575 m)   Wt 130 lb (59 kg)   BMI 23.78 kg/m  No acute distress well-developed well-nourished appears healthy

## 2019-12-05 ENCOUNTER — Other Ambulatory Visit (INDEPENDENT_AMBULATORY_CARE_PROVIDER_SITE_OTHER): Payer: BC Managed Care – PPO

## 2019-12-05 DIAGNOSIS — K51519 Left sided colitis with unspecified complications: Secondary | ICD-10-CM

## 2019-12-05 DIAGNOSIS — R197 Diarrhea, unspecified: Secondary | ICD-10-CM

## 2019-12-05 DIAGNOSIS — Z111 Encounter for screening for respiratory tuberculosis: Secondary | ICD-10-CM

## 2019-12-05 LAB — CBC WITH DIFFERENTIAL/PLATELET
Basophils Absolute: 0.1 10*3/uL (ref 0.0–0.1)
Basophils Relative: 1.4 % (ref 0.0–3.0)
Eosinophils Absolute: 0.8 10*3/uL — ABNORMAL HIGH (ref 0.0–0.7)
Eosinophils Relative: 8.9 % — ABNORMAL HIGH (ref 0.0–5.0)
HCT: 41.1 % (ref 36.0–46.0)
Hemoglobin: 13.9 g/dL (ref 12.0–15.0)
Lymphocytes Relative: 24.9 % (ref 12.0–46.0)
Lymphs Abs: 2.3 10*3/uL (ref 0.7–4.0)
MCHC: 33.8 g/dL (ref 30.0–36.0)
MCV: 91.3 fl (ref 78.0–100.0)
Monocytes Absolute: 1.6 10*3/uL — ABNORMAL HIGH (ref 0.1–1.0)
Monocytes Relative: 17 % — ABNORMAL HIGH (ref 3.0–12.0)
Neutro Abs: 4.5 10*3/uL (ref 1.4–7.7)
Neutrophils Relative %: 47.8 % (ref 43.0–77.0)
Platelets: 270 10*3/uL (ref 150.0–400.0)
RBC: 4.5 Mil/uL (ref 3.87–5.11)
RDW: 13.7 % (ref 11.5–15.5)
WBC: 9.4 10*3/uL (ref 4.0–10.5)

## 2019-12-05 LAB — C-REACTIVE PROTEIN: CRP: 2 mg/dL (ref 0.5–20.0)

## 2019-12-05 MED ORDER — PREDNISONE 10 MG PO TABS: 40.0000 mg | ORAL_TABLET | Freq: Every day | ORAL | 0 refills | Status: DC

## 2019-12-05 NOTE — Telephone Encounter (Signed)
Having watery diarrhea, worsening, slight bleeding No fever  Orders Placed This Encounter  Procedures  . Stool Culture  . CBC w/Diff  . QuantiFERON-TB Gold Plus  . Clostridium difficile Toxin B, Qualitative, Real-Time PCR([facility])  . C-reactive protein    Meds ordered this encounter  Medications  . predniSONE (DELTASONE) 10 MG tablet    Sig: Take 4 tablets (40 mg total) by mouth daily with breakfast. 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

## 2019-12-06 ENCOUNTER — Other Ambulatory Visit: Payer: BC Managed Care – PPO

## 2019-12-06 DIAGNOSIS — R197 Diarrhea, unspecified: Secondary | ICD-10-CM

## 2019-12-07 LAB — QUANTIFERON-TB GOLD PLUS
Mitogen-NIL: >10 IU/mL
NIL: 0.07 IU/mL
QuantiFERON-TB Gold Plus: NEGATIVE
TB1-NIL: 0.04 IU/mL
TB2-NIL: <0 IU/mL

## 2019-12-10 LAB — STOOL CULTURE
MICRO NUMBER:: 10042524
MICRO NUMBER:: 10042525
MICRO NUMBER:: 10042526
SHIGA RESULT:: NOT DETECTED
SPECIMEN QUALITY:: ADEQUATE
SPECIMEN QUALITY:: ADEQUATE
SPECIMEN QUALITY:: ADEQUATE

## 2019-12-10 LAB — CLOSTRIDIUM DIFFICILE TOXIN B, QUALITATIVE, REAL-TIME PCR: Toxigenic C. Difficile by PCR: NOT DETECTED

## 2019-12-13 ENCOUNTER — Ambulatory Visit
Admission: RE | Admit: 2019-12-13 | Discharge: 2019-12-13 | Disposition: A | Discharge status: Home / Self Care | Payer: BC Managed Care – PPO | Source: Ambulatory Visit | Attending: Family | Admitting: Family

## 2019-12-13 ENCOUNTER — Other Ambulatory Visit: Payer: Self-pay

## 2019-12-13 DIAGNOSIS — Z1231 Encounter for screening mammogram for malignant neoplasm of breast: Secondary | ICD-10-CM | POA: Diagnosis not present

## 2019-12-25 ENCOUNTER — Ambulatory Visit (INDEPENDENT_AMBULATORY_CARE_PROVIDER_SITE_OTHER): Payer: BC Managed Care – PPO | Admitting: Internal Medicine

## 2019-12-25 ENCOUNTER — Encounter: Payer: Self-pay | Admitting: Internal Medicine

## 2019-12-25 ENCOUNTER — Other Ambulatory Visit: Payer: Self-pay

## 2019-12-25 VITALS — BP 120/80 | HR 98 | Temp 98.9°F | Ht 62.5 in | Wt 130.2 lb

## 2019-12-25 DIAGNOSIS — K51518 Left sided colitis with other complication: Secondary | ICD-10-CM | POA: Diagnosis not present

## 2019-12-25 DIAGNOSIS — Z79899 Other long term (current) drug therapy: Secondary | ICD-10-CM | POA: Diagnosis not present

## 2019-12-25 DIAGNOSIS — Z7960 Long term (current) use of unspecified immunomodulators and immunosuppressants: Secondary | ICD-10-CM

## 2019-12-25 MED ORDER — HUMIRA 40 MG/0.8ML ~~LOC~~ PSKT: 40.0000 mg | PREFILLED_SYRINGE | SUBCUTANEOUS | 5 refills | Status: DC

## 2019-12-25 MED ORDER — HUMIRA-CD/UC/HS STARTER 80 MG/0.8ML ~~LOC~~ AJKT: 3.0000 "pen " | AUTO-INJECTOR | SUBCUTANEOUS | 0 refills | Status: DC

## 2019-12-25 NOTE — Patient Instructions (Signed)
Glad you are feeling better now.  We will prescribe the Humira and let you know when it goes through (or the pharmacy benefits manager you use will do that).  Once you start Humira message me and I will arrange follow-up.  Taper prednisone as follows   15 mg daily x 2 weeks 10 mg daily x 2 weeks 5 mg daily x 2 weeks  Then stop   .  I appreciate the opportunity to care for you. Gatha Mayer, MD, [facility]

## 2019-12-25 NOTE — Progress Notes (Signed)
Gloria Fox 44 y.o. Jan 05, 1976 [id number]  Assessment & Plan:  Left sided ulcerative colitis ([place]) Plan is to initiate Humira  CCFA biologic handout provided to the patient.  We have previously discussed the risk benefits indications she is aware of the increased risk of infections and extremely rare associations with malignancies. She has had several flares requiring steroids over the last couple of years and is now ready to initiate Biologics excepting the risks.  I explained how sometimes we would use immunomodulators I am not inclined to do that due to their increased risk of viral infection considering the pandemic I will hold off on that and its not established that we start with dual therapy and then withdrawal.  She is aware that Humira antibodies could develop and be a problem to deal with.  Once she starts the Humira she will let me know at and I will plan for a follow-up visit about 2 months after that.  Over time when I think she is solidly in remission we can try to withdraw Lialda.  Canasa as well.  We did not discuss endoscopic follow-up today but I think that is a reasonable thing to do looking for endoscopic remission we will review that going forward.  A flex sig may be all that is needed for that.    Long-term use of immunosuppressant medication- Humira To start Humira soon.  Risks benefits and indications reviewed with the patient.  She is QuantiFERON negative.  It will be due again in January 2022  I appreciate the opportunity to care for this patient. CC: Debbrah Alar, NP     Subjective:   Chief Complaint: Left-sided ulcerative colitis, discussed biologic therapy  HPI Gloria Fox had another flare of her left-sided ulcerative colitis, prednisone was started and she had a rapid response, she was having difficulty sleeping etc. she has tapered down to 20 mg and she is really asymptomatic at this point.  Over the last couple of years with her flares we  talked about going on Biologics and she is accepting of that now.  Her QuantiFERON test is negative she is immunized against hepatitis B.  She has researched things and is inclined to try Humira.  We have discussed possible side effects risks benefits etc. more than once in the past and I have done that with her again today. Allergies  Allergen Reactions  . Cephalexin Rash  . Sulfa Drugs Cross Reactors Rash   Current Meds  Medication Sig  . hyoscyamine (LEVSIN SL) 0.125 MG SL tablet DISSOLVE 1 TABLET UNDER THE SKIN EVERY 4 HOURS AS NEEDED  . levocetirizine (XYZAL) 5 MG tablet Take 1 tablet (5 mg total) by mouth every evening.  . mesalamine (CANASA) 1000 MG suppository INSERT 1 SUPPOSITORY RECTALLY AT BEDTIME  . mesalamine (LIALDA) 1.2 g EC tablet Take 2 tablets (2.4 g total) by mouth 2 (two) times daily.  . predniSONE (DELTASONE) 10 MG tablet Take 4 tablets (40 mg total) by mouth daily with breakfast. Taper advice to follow after reassessment  . SUMAtriptan (IMITREX) 50 MG tablet Take 1 tablet at the start of the migraine, may repeat in 2 hours as needed (max 2 tabs/24 hrs)  . triamcinolone (NASACORT) 55 MCG/ACT AERO nasal inhaler Place 2 sprays into the nose daily.   Past Medical History:  Diagnosis Date  . Allergy    SEASONAL  . Anemia   . Colitis 01/12/12   left colon  . History of bursitis    right shoulder  .  Lactose intolerance 07/03/2018   By hx  . Left breast mass 05/04/2019  . Pseudoangiomatous stromal hyperplasia of breast    left  . Sessile rectal polyp 01/12/12   Past Surgical History:  Procedure Laterality Date  . BREAST EXCISIONAL BIOPSY Left 05/04/2019  . BREAST LUMPECTOMY WITH RADIOACTIVE SEED LOCALIZATION Left 05/04/2019   Procedure: LEFT BREAST LUMPECTOMY WITH RADIOACTIVE SEED LOCALIZATION;  Surgeon: Fanny Skates, MD;  Location: [facility];  Service: General;  Laterality: Left;  . COLONOSCOPY  01/12/2012   repeat 2019 - Left UC  . CYSTECTOMY       reports hx of urethral cyst  . URETHRAL CYST REMOVAL     Social History   Social History Narrative   Married   2 sons- 1999 and 2001 - one in college one doing Chartered certified accountant and apprenticeship - [employer]   Enjoys spending time with family, beach, [place].    Never smoker/tobacco, no drugs, EtOH   family history includes Alcohol abuse in her maternal grandfather and paternal grandmother; Cancer in her mother; Colon cancer (age of onset: 82) in her maternal grandmother; Dementia in her maternal grandmother; Hypertension in her father, mother, and sister.   Review of Systems As above  Objective:   Physical Exam BP 120/80 (BP Location: Left Arm, Patient Position: Sitting, Cuff Size: Normal)   Pulse 98   Temp 98.9 F (37.2 C)   Ht 5' 2.5" (1.588 m) Comment: height measured without shoes  Wt 130 lb 4 oz (59.1 kg)   LMP 12/06/2019   BMI 23.44 kg/m  No acute distress well-developed well-nourished The abdomen is soft and nontender without organomegaly or mass

## 2019-12-25 NOTE — Assessment & Plan Note (Signed)
Plan is to initiate Humira  CCFA biologic handout provided to the patient.  We have previously discussed the risk benefits indications she is aware of the increased risk of infections and extremely rare associations with malignancies. She has had several flares requiring steroids over the last couple of years and is now ready to initiate Biologics excepting the risks.  I explained how sometimes we would use immunomodulators I am not inclined to do that due to their increased risk of viral infection considering the pandemic I will hold off on that and its not established that we start with dual therapy and then withdrawal.  She is aware that Humira antibodies could develop and be a problem to deal with.  Once she starts the Humira she will let me know at and I will plan for a follow-up visit about 2 months after that.  Over time when I think she is solidly in remission we can try to withdraw Lialda.  Canasa as well.  We did not discuss endoscopic follow-up today but I think that is a reasonable thing to do looking for endoscopic remission we will review that going forward.  A flex sig may be all that is needed for that.

## 2019-12-25 NOTE — Assessment & Plan Note (Signed)
To start Humira soon.  Risks benefits and indications reviewed with the patient.  She is QuantiFERON negative.  It will be due again in January 2022

## 2019-12-28 ENCOUNTER — Telehealth: Payer: Self-pay | Admitting: Internal Medicine

## 2019-12-28 NOTE — Telephone Encounter (Signed)
Leon from [employer] informed that [employer] requires a PA.  Please fax [phone].

## 2019-12-31 ENCOUNTER — Other Ambulatory Visit: Payer: Self-pay | Admitting: *Deleted

## 2019-12-31 MED ORDER — HUMIRA-CD/UC/HS STARTER 80 MG/0.8ML ~~LOC~~ AJKT: 3.0000 "pen " | AUTO-INJECTOR | SUBCUTANEOUS | 0 refills | Status: DC

## 2019-12-31 MED ORDER — HUMIRA 40 MG/0.8ML ~~LOC~~ PSKT: 40.0000 mg | PREFILLED_SYRINGE | SUBCUTANEOUS | 5 refills | Status: DC

## 2019-12-31 NOTE — Telephone Encounter (Signed)
New prescriptions for Humira sent to [facility] to complete the PA.

## 2020-01-01 NOTE — Telephone Encounter (Signed)
[facility] calls to do an over the phone PA.   Humira 40 mg /0.39m SQ every 14 days.  No word on the starter kit. The [facility] rep said it is in the system.  [facility] and support for the patient.

## 2020-01-02 ENCOUNTER — Other Ambulatory Visit: Payer: Self-pay

## 2020-01-02 NOTE — Telephone Encounter (Signed)
Starter kit and maintenance dosage of Humira approved by [facility] for 1 year. Rx through [employer].

## 2020-01-28 ENCOUNTER — Other Ambulatory Visit: Payer: Self-pay

## 2020-01-28 MED ORDER — HUMIRA (2 PEN) 40 MG/0.4ML ~~LOC~~ AJKT: 40.0000 mg | AUTO-INJECTOR | SUBCUTANEOUS | 11 refills | Status: DC

## 2020-01-28 NOTE — Progress Notes (Signed)
New Rx faxed to [facility] for Humira pen kit. Changed from 40 mg /0.40m to Humira CF 462m0.4ml

## 2020-01-31 ENCOUNTER — Other Ambulatory Visit: Payer: Self-pay

## 2020-01-31 MED ORDER — HUMIRA (2 PEN) 40 MG/0.4ML ~~LOC~~ AJKT: 40.0000 mg | AUTO-INJECTOR | SUBCUTANEOUS | 11 refills | Status: DC

## 2020-04-29 ENCOUNTER — Ambulatory Visit (INDEPENDENT_AMBULATORY_CARE_PROVIDER_SITE_OTHER): Payer: BC Managed Care – PPO | Admitting: Internal Medicine

## 2020-04-29 ENCOUNTER — Encounter: Payer: Self-pay | Admitting: Internal Medicine

## 2020-04-29 VITALS — BP 118/70 | HR 96 | Ht 62.0 in | Wt 128.0 lb

## 2020-04-29 DIAGNOSIS — Z79899 Other long term (current) drug therapy: Secondary | ICD-10-CM

## 2020-04-29 DIAGNOSIS — T8089XA Other complications following infusion, transfusion and therapeutic injection, initial encounter: Secondary | ICD-10-CM | POA: Insufficient documentation

## 2020-04-29 DIAGNOSIS — K5150 Left sided colitis without complications: Secondary | ICD-10-CM

## 2020-04-29 DIAGNOSIS — Z7960 Long term (current) use of unspecified immunomodulators and immunosuppressants: Secondary | ICD-10-CM

## 2020-04-29 NOTE — Assessment & Plan Note (Addendum)
It sounds like she is in remission clinically at this point.  We will continue Humira and at about the 10-monthpoint I think we could consider stopping the mesalamine perhaps tapering as opposed to an abrupt discontinuation.  I should see her again in about 6 to 9 months.  We will need to discuss clinical follow-up versus endoscopic reassessment.

## 2020-04-29 NOTE — Assessment & Plan Note (Signed)
She is having a localized skin reaction that is improving and hopefully will eventually stop occurring.  Symptomatic treatment as she is makes sense.

## 2020-04-29 NOTE — Assessment & Plan Note (Signed)
Continue p.o. Benadryl and topical corticosteroids as needed hopefully this will eventually disappear with time

## 2020-04-29 NOTE — Patient Instructions (Addendum)
If you are age 43 or older, your body mass index should be between 23-30. Your Body mass index is 23.41 kg/m. If this is out of the aforementioned range listed, please consider follow up with your Primary Care Provider.  If you are age 35 or younger, your body mass index should be between 19-25. Your Body mass index is 23.41 kg/m. If this is out of the aformentioned range listed, please consider follow up with your Primary Care Provider.   In September about 6 months on Humira send message to Dr. [name] with update and may be able to stop Mesalamine.   Thank you for choosing me and [facility].  Silvano Rusk, MD

## 2020-04-29 NOTE — Progress Notes (Signed)
Gloria Fox 44 y.o. March 21, 1976 [id number]  Assessment & Plan:  Left sided ulcerative colitis ([place]) It sounds like she is in remission clinically at this point.  We will continue Humira and at about the 22-monthpoint I think we could consider stopping the mesalamine perhaps tapering as opposed to an abrupt discontinuation.  I should see her again in about 6 to 9 months.  We will need to discuss clinical follow-up versus endoscopic reassessment.  Long-term use of immunosuppressant medication- Humira She is having a localized skin reaction that is improving and hopefully will eventually stop occurring.  Symptomatic treatment as she is makes sense.  Injection site irritation, initial encounter Continue p.o. Benadryl and topical corticosteroids as needed hopefully this will eventually disappear with time      Subjective:   Chief Complaint: Follow-up of left-sided ulcerative colitis on Humira  HPI Gloria Fox here for follow-up of her left-sided ulcerative colitis, she had experienced flares and required prednisone a few times and was started on Humira about 3 months ago.  She has no symptoms of her ulcerative colitis at this time.  She is having some issues with a localized skin reaction and shows me small mild hyperpigmented slightly erythematous site on her right lower quadrant where she injected the other day.  She says the size of the reactions is improving with time and is less symptomatic she treats with Benadryl cream and/or hydrocortisone cream and she is taking 2 Benadryl's before her injection.  She feels like there is improvement in this.  Again ulcerative colitis is completely under control with normal bowel movements no pain she is tolerating anything she wants to eat without problems.  She is now vaccinated for Covid. Allergies  Allergen Reactions  . Cephalexin Rash  . Sulfa Drugs Cross Reactors Rash   Current Meds  Medication Sig  . Adalimumab (HUMIRA PEN) 40  MG/0.4ML PNKT Inject 40 mg into the skin every 14 (fourteen) days.  . hyoscyamine (LEVSIN SL) 0.125 MG SL tablet DISSOLVE 1 TABLET UNDER THE SKIN EVERY 4 HOURS AS NEEDED  . loratadine (CLARITIN) 10 MG tablet Take 10 mg by mouth daily.  . mesalamine (LIALDA) 1.2 g EC tablet Take 2 tablets (2.4 g total) by mouth 2 (two) times daily.  . SUMAtriptan (IMITREX) 50 MG tablet Take 1 tablet at the start of the migraine, may repeat in 2 hours as needed (max 2 tabs/24 hrs)  . triamcinolone (NASACORT) 55 MCG/ACT AERO nasal inhaler Place 2 sprays into the nose daily.   Past Medical History:  Diagnosis Date  . Allergy    SEASONAL  . Anemia   . Colitis 01/12/12   left colon  . History of bursitis    right shoulder  . Lactose intolerance 07/03/2018   By hx  . Left breast mass 05/04/2019  . Pseudoangiomatous stromal hyperplasia of breast    left  . Sessile rectal polyp 01/12/12   Past Surgical History:  Procedure Laterality Date  . BREAST EXCISIONAL BIOPSY Left 05/04/2019  . BREAST LUMPECTOMY WITH RADIOACTIVE SEED LOCALIZATION Left 05/04/2019   Procedure: LEFT BREAST LUMPECTOMY WITH RADIOACTIVE SEED LOCALIZATION;  Surgeon: IFanny Skates MD;  Location[facility]  Service: General;  Laterality: Left;  . COLONOSCOPY  01/12/2012   repeat 2019 - Left UC  . CYSTECTOMY     reports hx of urethral cyst  . URETHRAL CYST REMOVAL     Social History   Social History Narrative   Married   2  sons- 1999 and 2001 - one in college one doing [employer]   Enjoys spending time with family, beach, [place].    Never smoker/tobacco, no drugs, EtOH   family history includes Alcohol abuse in her maternal grandfather and paternal grandmother; Cancer in her mother; Colon cancer (age of onset: 30) in her maternal grandmother; Dementia in her maternal grandmother; Hypertension in her father, mother, and sister.   Review of Systems As  above  Objective:   Physical Exam BP 118/70   Pulse 96   Ht 5' 2"  (1.575 m)   Wt 128 lb (58.1 kg)   BMI 23.41 kg/m  See HPI for description of skin injection site reaction

## 2020-08-28 ENCOUNTER — Other Ambulatory Visit: Payer: Self-pay

## 2020-08-28 MED ORDER — SUMATRIPTAN SUCCINATE 50 MG PO TABS: 50.0000 mg | ORAL_TABLET | ORAL | 0 refills | Status: AC | PRN

## 2021-01-21 ENCOUNTER — Other Ambulatory Visit: Payer: Self-pay | Admitting: Family

## 2021-01-21 DIAGNOSIS — Z1231 Encounter for screening mammogram for malignant neoplasm of breast: Secondary | ICD-10-CM

## 2021-01-27 ENCOUNTER — Encounter: Payer: Self-pay | Admitting: Internal Medicine

## 2021-01-27 ENCOUNTER — Other Ambulatory Visit: Payer: BC Managed Care – PPO

## 2021-01-27 ENCOUNTER — Ambulatory Visit (INDEPENDENT_AMBULATORY_CARE_PROVIDER_SITE_OTHER): Payer: BC Managed Care – PPO | Admitting: Internal Medicine

## 2021-01-27 ENCOUNTER — Other Ambulatory Visit: Payer: Self-pay

## 2021-01-27 ENCOUNTER — Other Ambulatory Visit (INDEPENDENT_AMBULATORY_CARE_PROVIDER_SITE_OTHER): Payer: BC Managed Care – PPO

## 2021-01-27 VITALS — BP 120/70 | HR 89 | Ht 62.0 in | Wt 127.0 lb

## 2021-01-27 DIAGNOSIS — Z7960 Long term (current) use of unspecified immunomodulators and immunosuppressants: Secondary | ICD-10-CM

## 2021-01-27 DIAGNOSIS — Z111 Encounter for screening for respiratory tuberculosis: Secondary | ICD-10-CM | POA: Diagnosis not present

## 2021-01-27 DIAGNOSIS — Z79899 Other long term (current) drug therapy: Secondary | ICD-10-CM | POA: Diagnosis not present

## 2021-01-27 DIAGNOSIS — K5150 Left sided colitis without complications: Secondary | ICD-10-CM

## 2021-01-27 NOTE — Patient Instructions (Signed)
Your provider has requested that you go to the basement level for lab work before leaving today. Press "B" on the elevator. The lab is located at the first door on the left as you exit the elevator.  Due to recent changes in healthcare laws, you may see the results of your imaging and laboratory studies on MyChart before your provider has had a chance to review them.  We understand that in some cases there may be results that are confusing or concerning to you. Not all laboratory results come back in the same time frame and the provider may be waiting for multiple results in order to interpret others.  Please give [place] 48 hours in order for your provider to thoroughly review all the results before contacting the office for clarification of your results.   Follow up in 9 to 12 months with Dr [name].   I appreciate the opportunity to care for you. Silvano Rusk, MD, [facility]

## 2021-01-27 NOTE — Assessment & Plan Note (Signed)
No significant side effects (prior thinning of hair, some injection site issue she is on citrate free she thinks she is managing with Benadryl) check QuantiFERON today

## 2021-01-27 NOTE — Progress Notes (Signed)
Daylen Hack 45 y.o. [birth year] [id number]  Assessment & Plan:   Left sided ulcerative colitis ([place]) Doing well RTC 9-12 mos Check labs including fecal calprotectin if calprotectin elevated consider colonoscopy  Long-term use of immunosuppressant medication- Humira No significant side effects (prior thinning of hair, some injection site issue she is on citrate free she thinks she is managing with Benadryl) check QuantiFERON today   Orders Placed This Encounter  Procedures  . Calprotectin, Fecal  . CBC w/Diff  . Comp Met (CMET)  . QuantiFERON-TB Gold Plus    I appreciate the opportunity to care for this patient. CC: Debbrah Alar, NP  Subjective:   Chief Complaint: Follow-up of left-sided ulcerative colitis  HPI [name] is a 45 year old woman with left-sided ulcerative colitis, transitioned to Humira monotherapy within the last year we added that in 2021.  She is off mesalamine and doing well.  She elected not to take 6-mercaptopurine in the setting of the Covid pandemic and concerns about viral immunity.  She reports that she has formed bowel movements no bleeding and feels really good.  She was last seen in June 2021.  She has had some injection site reactions but that is not so bad and she uses Benadryl pretreatment.  She had some thinning of her hair with the Humira but that has stabilized and not progressed. Allergies  Allergen Reactions  . Cephalexin Rash  . Sulfa Drugs Cross Reactors Rash   Current Meds  Medication Sig  . Adalimumab (HUMIRA PEN) 40 MG/0.4ML PNKT Inject 40 mg into the skin every 14 (fourteen) days.  . hyoscyamine (LEVSIN SL) 0.125 MG SL tablet DISSOLVE 1 TABLET UNDER THE SKIN EVERY 4 HOURS AS NEEDED  . loratadine (CLARITIN) 10 MG tablet Take 10 mg by mouth daily.  . SUMAtriptan (IMITREX) 50 MG tablet Take 1 tablet (50 mg total) by mouth as needed for migraine. Take 1 tablet at the start of the migraine, may repeat in 2 hours as needed  (max 2 tabs/24 hrs)  . triamcinolone (NASACORT) 55 MCG/ACT AERO nasal inhaler Place 2 sprays into the nose daily.   Past Medical History:  Diagnosis Date  . Allergy    SEASONAL  . Anemia   . Colitis 01/12/12   left colon  . History of bursitis    right shoulder  . Lactose intolerance 07/03/2018   By hx  . Left breast mass 05/04/2019  . Pseudoangiomatous stromal hyperplasia of breast    left  . Sessile rectal polyp 01/12/12   Past Surgical History:  Procedure Laterality Date  . BREAST EXCISIONAL BIOPSY Left 05/04/2019  . BREAST LUMPECTOMY WITH RADIOACTIVE SEED LOCALIZATION Left 05/04/2019   Procedure: LEFT BREAST LUMPECTOMY WITH RADIOACTIVE SEED LOCALIZATION;  Surgeon: Fanny Skates, MD;  Location: [facility];  Service: General;  Laterality: Left;  . COLONOSCOPY  01/12/2012   repeat 2019 - Left UC  . CYSTECTOMY     reports hx of urethral cyst  . 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    Never smoker/tobacco, no drugs, EtOH   family history includes Alcohol abuse in her maternal grandfather and paternal grandmother; Cancer in her mother; Colon cancer (age of onset: 77) in her maternal grandmother; Dementia in her maternal grandmother; Hypertension in  her father, mother, and sister.   Review of Systems As above  Objective:   Physical Exam @BP  120/70   Pulse 89   Ht 5' 2"  (1.575 m)   Wt 127 lb (57.6 kg)   BMI 23.23 kg/m @  General:  NAD Eyes:   anicteric Lungs:  clear Heart::  S1S2 no rubs, murmurs or gallops Abdomen:  soft and nontender, BS+ Ext:   no edema, cyanosis or clubbing    Data Reviewed:  See HPI

## 2021-01-27 NOTE — Assessment & Plan Note (Addendum)
Doing well RTC 9-12 mos Check labs including fecal calprotectin if calprotectin elevated consider colonoscopy

## 2021-01-28 LAB — CBC WITH DIFFERENTIAL/PLATELET
Basophils Absolute: 0.1 10*3/uL (ref 0.0–0.1)
Basophils Relative: 0.9 % (ref 0.0–3.0)
Eosinophils Absolute: 0.2 10*3/uL (ref 0.0–0.7)
Eosinophils Relative: 2.6 % (ref 0.0–5.0)
HCT: 44.3 % (ref 36.0–46.0)
Hemoglobin: 15.1 g/dL — ABNORMAL HIGH (ref 12.0–15.0)
Lymphocytes Relative: 31.8 % (ref 12.0–46.0)
Lymphs Abs: 2.6 10*3/uL (ref 0.7–4.0)
MCHC: 34.2 g/dL (ref 30.0–36.0)
MCV: 90.9 fl (ref 78.0–100.0)
Monocytes Absolute: 0.7 10*3/uL (ref 0.1–1.0)
Monocytes Relative: 8.7 % (ref 3.0–12.0)
Neutro Abs: 4.5 10*3/uL (ref 1.4–7.7)
Neutrophils Relative %: 56 % (ref 43.0–77.0)
Platelets: 217 10*3/uL (ref 150.0–400.0)
RBC: 4.88 Mil/uL (ref 3.87–5.11)
RDW: 14 % (ref 11.5–15.5)
WBC: 8.1 10*3/uL (ref 4.0–10.5)

## 2021-01-28 LAB — COMPREHENSIVE METABOLIC PANEL
ALT: 12 U/L (ref 0–35)
AST: 16 U/L (ref 0–37)
Albumin: 4.1 g/dL (ref 3.5–5.2)
Alkaline Phosphatase: 60 U/L (ref 39–117)
BUN: 17 mg/dL (ref 6–23)
CO2: 24 mEq/L (ref 19–32)
Calcium: 9.3 mg/dL (ref 8.4–10.5)
Chloride: 104 mEq/L (ref 96–112)
Creatinine, Ser: 0.78 mg/dL (ref 0.40–1.20)
GFR: 92.33 mL/min (ref >60.00)
Glucose, Bld: 73 mg/dL (ref 70–99)
Potassium: 3.9 mEq/L (ref 3.5–5.1)
Sodium: 137 mEq/L (ref 135–145)
Total Bilirubin: 0.5 mg/dL (ref 0.2–1.2)
Total Protein: 7.5 g/dL (ref 6.0–8.3)

## 2021-01-29 LAB — QUANTIFERON-TB GOLD PLUS
Mitogen-NIL: >10 IU/mL
NIL: 0.06 IU/mL
QuantiFERON-TB Gold Plus: NEGATIVE
TB1-NIL: 0.04 IU/mL
TB2-NIL: 0.08 IU/mL

## 2021-01-30 ENCOUNTER — Other Ambulatory Visit: Payer: BC Managed Care – PPO

## 2021-01-30 DIAGNOSIS — K5150 Left sided colitis without complications: Secondary | ICD-10-CM

## 2021-02-03 LAB — CALPROTECTIN, FECAL: Calprotectin, Fecal: 764 ug/g — ABNORMAL HIGH (ref 0–120)

## 2021-02-04 ENCOUNTER — Other Ambulatory Visit: Payer: Self-pay | Admitting: Internal Medicine

## 2021-02-04 DIAGNOSIS — K5150 Left sided colitis without complications: Secondary | ICD-10-CM

## 2021-02-04 DIAGNOSIS — Z7960 Long term (current) use of unspecified immunomodulators and immunosuppressants: Secondary | ICD-10-CM

## 2021-02-04 DIAGNOSIS — Z79899 Other long term (current) drug therapy: Secondary | ICD-10-CM

## 2021-02-16 ENCOUNTER — Other Ambulatory Visit: Payer: BC Managed Care – PPO

## 2021-02-16 DIAGNOSIS — K5150 Left sided colitis without complications: Secondary | ICD-10-CM

## 2021-02-16 DIAGNOSIS — Z7960 Long term (current) use of unspecified immunomodulators and immunosuppressants: Secondary | ICD-10-CM

## 2021-02-16 DIAGNOSIS — Z79899 Other long term (current) drug therapy: Secondary | ICD-10-CM

## 2021-02-23 LAB — SERIAL MONITORING

## 2021-02-24 LAB — ADALIMUMAB+AB (SERIAL MONITOR)
Adalimumab Drug Level: 11 ug/mL
Anti-Adalimumab Antibody: <25 ng/mL

## 2021-02-27 ENCOUNTER — Other Ambulatory Visit: Payer: Self-pay | Admitting: Internal Medicine

## 2021-02-27 DIAGNOSIS — K5150 Left sided colitis without complications: Secondary | ICD-10-CM

## 2021-03-06 ENCOUNTER — Other Ambulatory Visit: Payer: Self-pay

## 2021-03-06 ENCOUNTER — Ambulatory Visit
Admission: RE | Admit: 2021-03-06 | Discharge: 2021-03-06 | Disposition: A | Discharge status: Home / Self Care | Payer: BC Managed Care – PPO | Source: Ambulatory Visit

## 2021-03-06 DIAGNOSIS — Z1231 Encounter for screening mammogram for malignant neoplasm of breast: Secondary | ICD-10-CM | POA: Diagnosis not present

## 2021-03-10 ENCOUNTER — Encounter: Payer: Self-pay | Admitting: Internal Medicine

## 2021-03-11 ENCOUNTER — Encounter: Payer: Self-pay | Admitting: Internal Medicine

## 2021-03-26 ENCOUNTER — Encounter: Payer: Self-pay | Admitting: Internal Medicine

## 2021-05-06 ENCOUNTER — Other Ambulatory Visit: Payer: Self-pay | Admitting: Internal Medicine

## 2022-01-08 ENCOUNTER — Encounter: Payer: Self-pay | Admitting: Internal Medicine

## 2022-02-01 ENCOUNTER — Other Ambulatory Visit (INDEPENDENT_AMBULATORY_CARE_PROVIDER_SITE_OTHER): Payer: BC Managed Care – PPO

## 2022-02-01 ENCOUNTER — Encounter: Payer: Self-pay | Admitting: Internal Medicine

## 2022-02-01 ENCOUNTER — Ambulatory Visit (INDEPENDENT_AMBULATORY_CARE_PROVIDER_SITE_OTHER): Payer: BC Managed Care – PPO | Admitting: Internal Medicine

## 2022-02-01 VITALS — BP 128/82 | HR 64 | Ht 62.0 in | Wt 129.8 lb

## 2022-02-01 DIAGNOSIS — K5150 Left sided colitis without complications: Secondary | ICD-10-CM | POA: Diagnosis not present

## 2022-02-01 DIAGNOSIS — Z7960 Long term (current) use of unspecified immunomodulators and immunosuppressants: Secondary | ICD-10-CM

## 2022-02-01 DIAGNOSIS — Z111 Encounter for screening for respiratory tuberculosis: Secondary | ICD-10-CM | POA: Diagnosis not present

## 2022-02-01 LAB — COMPREHENSIVE METABOLIC PANEL
ALT: 12 U/L (ref 0–35)
AST: 13 U/L (ref 0–37)
Albumin: 4.2 g/dL (ref 3.5–5.2)
Alkaline Phosphatase: 59 U/L (ref 39–117)
BUN: 16 mg/dL (ref 6–23)
CO2: 23 mEq/L (ref 19–32)
Calcium: 8.9 mg/dL (ref 8.4–10.5)
Chloride: 107 mEq/L (ref 96–112)
Creatinine, Ser: 0.79 mg/dL (ref 0.40–1.20)
GFR: 90.29 mL/min (ref >60.00)
Glucose, Bld: 91 mg/dL (ref 70–99)
Potassium: 4 mEq/L (ref 3.5–5.1)
Sodium: 137 mEq/L (ref 135–145)
Total Bilirubin: 0.4 mg/dL (ref 0.2–1.2)
Total Protein: 6.9 g/dL (ref 6.0–8.3)

## 2022-02-01 LAB — CBC WITH DIFFERENTIAL/PLATELET
Basophils Absolute: 0 10*3/uL (ref 0.0–0.1)
Basophils Relative: 0.6 % (ref 0.0–3.0)
Eosinophils Absolute: 0.2 10*3/uL (ref 0.0–0.7)
Eosinophils Relative: 2.1 % (ref 0.0–5.0)
HCT: 42.8 % (ref 36.0–46.0)
Hemoglobin: 14.4 g/dL (ref 12.0–15.0)
Lymphocytes Relative: 28.6 % (ref 12.0–46.0)
Lymphs Abs: 2.4 10*3/uL (ref 0.7–4.0)
MCHC: 33.6 g/dL (ref 30.0–36.0)
MCV: 91.3 fl (ref 78.0–100.0)
Monocytes Absolute: 0.7 10*3/uL (ref 0.1–1.0)
Monocytes Relative: 8.5 % (ref 3.0–12.0)
Neutro Abs: 5 10*3/uL (ref 1.4–7.7)
Neutrophils Relative %: 60.2 % (ref 43.0–77.0)
Platelets: 239 10*3/uL (ref 150.0–400.0)
RBC: 4.69 Mil/uL (ref 3.87–5.11)
RDW: 13.8 % (ref 11.5–15.5)
WBC: 8.2 10*3/uL (ref 4.0–10.5)

## 2022-02-01 NOTE — Patient Instructions (Signed)
Your provider has requested that you go to the basement level for lab work before leaving today. Press "B" on the elevator. The lab is located at the first door on the left as you exit the elevator. ? ?Due to recent changes in healthcare laws, you may see the results of your imaging and laboratory studies on MyChart before your provider has had a chance to review them.  We understand that in some cases there may be results that are confusing or concerning to you. Not all laboratory results come back in the same time frame and the provider may be waiting for multiple results in order to interpret others.  Please give [place] 48 hours in order for your provider to thoroughly review all the results before contacting the office for clarification of your results.  ? ?If you are age 70 or older, your body mass index should be between 23-30. Your Body mass index is 23.74 kg/m?Marland Kitchen If this is out of the aforementioned range listed, please consider follow up with your Primary Care Provider. ? ?If you are age 65 or younger, your body mass index should be between 19-25. Your Body mass index is 23.74 kg/m?Marland Kitchen If this is out of the aformentioned range listed, please consider follow up with your Primary Care Provider.  ? ?________________________________________________________ ? ?The  GI providers would like to encourage you to use [facility] to communicate with providers for non-urgent requests or questions.  Due to long hold times on the telephone, sending your provider a message by [facility] may be a faster and more efficient way to get a response.  Please allow 48 business hours for a response.  Please remember that this is for non-urgent requests.  ?_______________________________________________________ ? ? ?You have been scheduled for a colonoscopy. Please follow written instructions given to you at your visit today.  ?Please pick up your prep supplies at the pharmacy within the next 1-3 days. ?If you use inhalers (even only as  needed), please bring them with you on the day of your procedure. ? ?I appreciate the opportunity to care for you. ?Silvano Rusk, MD, [facility] ?

## 2022-02-01 NOTE — Progress Notes (Signed)
? ?Gloria Fox 46 y.o. [birth year] [id number] ? ?Assessment & Plan:  ? ?Encounter Diagnoses  ?Name Primary?  ? Left sided ulcerative colitis without complication ([place]) Yes  ? Long-term use of immunosuppressant medication- Humira   ? ?Clinically she is well.  I am going to repeat lab assessment with CBC, CMET, QuantiFERON testing.  Also a fecal calprotectin.  She was diagnosed in 2013.  It is left-sided disease but that its been 10 years she had elevated calprotectin last year so I think it is prudent to do a colonoscopy as well.  I want another calprotectin in case we need to follow that. ? ?CC: Gloria Alar, NP ? ?Subjective:  ? ?Chief Complaint: Follow-up left-sided colitis ? ?HPI ?Gloria Fox presents today without GI symptoms.  Outlined below were recent visits and procedures and labs.  When she was seen last year she had an elevated fecal calprotectin but really felt reasonably well.  She ended up not taking mesalamine like I had planned after seeing that she had normal adalimumab level and no antibodies in that setting.  She had a dental extraction within the past several months that went well she was going to interrupt Humira but decided not to.  I told her it was plus minus.  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. ? ? ?Adalimumab level 11 trough and no antibodies March 2022 and was having some symptoms and positive calprotectin at 764 then so mesalamine was added back ?I also had to write a letter to support insurance coverage for the test ? ? ?Assessment and plan from last visit April 29, 2020 ?Left sided ulcerative colitis ([place]) ?It sounds like she is in remission clinically at this point.  We will continue Humira and at about the 18-monthpoint I think we could consider stopping the mesalamine perhaps tapering as opposed to an abrupt discontinuation.  I should see her again in about 6 to 9 months.  We will need to discuss clinical follow-up versus endoscopic reassessment. ?   ?Long-term use of immunosuppressant medication- Humira ?She is having a localized skin reaction that is improving and hopefully will eventually stop occurring.  Symptomatic treatment as she is makes sense. ?  ?Injection site irritation, initial encounter ?Continue p.o. Benadryl and topical corticosteroids as needed hopefully this will eventually disappear with time ? ? ? ? ?Colonoscopy Apr 04, 2018 ?- Left-sided colitis. Inflammation was found from the rectum to the sigmoid colon. This was ?mild in severity, improved compared to previous examinations. Biopsied. ?- The examined portion of the ileum was normal. ?- The examination was otherwise normal on direct and retroflexion views. ?- Biopsies were taken with a cold forceps from the cecum, ascending colon and transverse ?colon for evaluation of microscopic colitis. ?Diagnosis ?1. Surgical [P], transverse and right colon ?- BENIGN COLONIC MUCOSA ?- NO ACTIVE INFLAMMATION, HIGH GRADE DYSPLASIA OR MALIGNANCY IDENTIFIED ?2. Surgical [P], left colon and rectal ?- CHRONIC MILDLY ACTIVE COLITIS ?- NO GRANULOMATA, DYSPLASIA OR MALIGNANCY IDENTIFIED ? ?Allergies  ?Allergen Reactions  ? Cephalexin Rash  ? Sulfa Drugs Cross Reactors Rash  ? ?Current Meds  ?Medication Sig  ? Adalimumab (HUMIRA PEN) 40 MG/0.4ML PNKT Inject 40 mg into the skin every 14 (fourteen) days.  ? hyoscyamine (LEVSIN SL) 0.125 MG SL tablet DISSOLVE 1 TABLET UNDER THE SKIN EVERY 4 HOURS AS NEEDED  ? loratadine (CLARITIN) 10 MG tablet Take 10 mg by mouth daily.  ? SUMAtriptan (IMITREX) 50 MG tablet Take 1 tablet (50 mg total)  by mouth as needed for migraine. Take 1 tablet at the start of the migraine, may repeat in 2 hours as needed (max 2 tabs/24 hrs)  ? triamcinolone (NASACORT) 55 MCG/ACT AERO nasal inhaler Place 2 sprays into the nose daily.  ? ?Past Medical History:  ?Diagnosis Date  ? Allergy   ? SEASONAL  ? Anemia   ? Colitis 01/12/12  ? left colon  ? History of bursitis   ? right shoulder  ? Lactose  intolerance 07/03/2018  ? By hx  ? Left breast mass 05/04/2019  ? Pseudoangiomatous stromal hyperplasia of breast   ? left  ? Sessile rectal polyp 01/12/12  ? ?Past Surgical History:  ?Procedure Laterality Date  ? BREAST EXCISIONAL BIOPSY Left 05/04/2019  ? BREAST LUMPECTOMY WITH RADIOACTIVE SEED LOCALIZATION Left 05/04/2019  ? Procedure: LEFT BREAST LUMPECTOMY WITH RADIOACTIVE SEED LOCALIZATION;  Surgeon: Fanny Skates, MD;  Location: [facility];  Service: General;  Laterality: Left;  ? COLONOSCOPY  01/12/2012  ? repeat 2019 - Left UC  ? CYSTECTOMY    ? reports hx of urethral cyst  ? URETHRAL CYST REMOVAL    ? ?Social History  ? ?Social History Narrative  ? Married  ? 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 enjoys spending time with family, beach, [place].   ? Never smoker/tobacco, no drugs, EtOH  ? ?family history includes Alcohol abuse in her maternal grandfather and paternal grandmother; Cancer in her mother; Colon cancer (age of onset: 20) in her maternal grandmother; Dementia in her maternal grandmother; Hypertension in her father, mother, and sister. ? ? ?Review of Systems ?As above ? ?Objective:  ? Physical Exam ?@BP  128/82   Pulse 64   Ht 5' 2"  (1.575 m)   Wt 129 lb 12.8 oz (58.9 kg)   BMI 23.74 kg/m? @ ? ?General:  NAD ?Eyes:   anicteric ?Lungs:  clear ?Heart::  S1S2 no rubs, murmurs or gallops ?Abdomen:  soft and nontender, BS+ ?Ext:   no edema, cyanosis or clubbing ? ? ? ?Data Reviewed:  ?See HPI ? ?

## 2022-02-05 LAB — QUANTIFERON-TB GOLD PLUS
Mitogen-NIL: >10 IU/mL
NIL: 0.08 IU/mL
QuantiFERON-TB Gold Plus: NEGATIVE
TB1-NIL: 0.04 IU/mL
TB2-NIL: <0 IU/mL

## 2022-02-12 ENCOUNTER — Other Ambulatory Visit: Payer: BC Managed Care – PPO

## 2022-02-12 DIAGNOSIS — K5150 Left sided colitis without complications: Secondary | ICD-10-CM

## 2022-02-12 DIAGNOSIS — Z7960 Long term (current) use of unspecified immunomodulators and immunosuppressants: Secondary | ICD-10-CM

## 2022-02-15 ENCOUNTER — Encounter: Payer: Self-pay | Admitting: Internal Medicine

## 2022-02-17 LAB — CALPROTECTIN, FECAL: Calprotectin, Fecal: 270 ug/g — ABNORMAL HIGH (ref 0–120)

## 2022-02-19 ENCOUNTER — Ambulatory Visit (AMBULATORY_SURGERY_CENTER): Payer: BC Managed Care – PPO | Admitting: Internal Medicine

## 2022-02-19 ENCOUNTER — Encounter: Payer: Self-pay | Admitting: Internal Medicine

## 2022-02-19 VITALS — BP 127/63 | HR 72 | Temp 97.5°F | Resp 17 | Ht 62.0 in | Wt 129.0 lb

## 2022-02-19 DIAGNOSIS — K5150 Left sided colitis without complications: Secondary | ICD-10-CM | POA: Diagnosis not present

## 2022-02-19 DIAGNOSIS — K5190 Ulcerative colitis, unspecified, without complications: Secondary | ICD-10-CM | POA: Diagnosis not present

## 2022-02-19 MED ORDER — SODIUM CHLORIDE 0.9 % IV SOLN: 500.0000 mL | Freq: Once | INTRAVENOUS | Status: DC

## 2022-02-19 NOTE — Progress Notes (Signed)
VS by CW  Pt's states no medical or surgical changes since previsit or office visit.  

## 2022-02-19 NOTE — Progress Notes (Signed)
To Pacu, VSS. Report to [email or web address] ?

## 2022-02-19 NOTE — Progress Notes (Signed)
Called to room to assist during endoscopic procedure.  Patient ID and intended procedure confirmed with present staff. Received instructions for my participation in the procedure from the performing physician.  

## 2022-02-19 NOTE — Op Note (Signed)
[facility] ?Patient Name: Gloria Fox ?Procedure Date: 02/19/2022 1:33 PM ?MRN: [id number] ?Endoscopist: Gatha Mayer , MD ?Age: 46 ?Referring MD:  ?Date of Birth: [birth year] ?Gender: Female ?Account #: [id number] ?Procedure:                Colonoscopy ?Indications:              Left-sided chronic ulcerative colitis, Follow-up of  ?                          left-sided chronic ulcerative colitis, Disease  ?                          activity assessment of left-sided chronic  ?                          ulcerative colitis, Assess therapeutic response to  ?                          therapy of left-sided chronic ulcerative colitis On  ?                          Humira 40 mg every 2 weeks, calprotectin elevated  ?                          278, no symptoms ?Medicines:                Monitored Anesthesia Care ?Procedure:                Pre-Anesthesia Assessment: ?                          - Prior to the procedure, a History and Physical  ?                          was performed, and patient medications and  ?                          allergies were reviewed. The patient's tolerance of  ?                          previous anesthesia was also reviewed. The risks  ?                          and benefits of the procedure and the sedation  ?                          options and risks were discussed with the patient.  ?                          All questions were answered, and informed consent  ?                          was obtained. Prior Anticoagulants: The patient has  ?  taken no previous anticoagulant or antiplatelet  ?                          agents. ASA Grade Assessment: II - A patient with  ?                          mild systemic disease. After reviewing the risks  ?                          and benefits, the patient was deemed in  ?                          satisfactory condition to undergo the procedure. ?                          After obtaining informed consent, the colonoscope   ?                          was passed under direct vision. Throughout the  ?                          procedure, the patient's blood pressure, pulse, and  ?                          oxygen saturations were monitored continuously. The  ?                          Olympus CF-HQ190L (Serial# [id number]) Colonoscope was  ?                          introduced through the anus and advanced to the the  ?                          terminal ileum, with identification of the  ?                          appendiceal orifice and IC valve. The colonoscopy  ?                          was performed without difficulty. The patient  ?                          tolerated the procedure well. The quality of the  ?                          bowel preparation was excellent. The terminal  ?                          ileum, ileocecal valve, appendiceal orifice, and  ?                          rectum were photographed. The bowel preparation  ?  used was Miralax via split dose instruction. ?Scope In: 1:39:47 PM ?Scope Out: 1:55:08 PM ?Scope Withdrawal Time: 0 hours 11 minutes 0 seconds  ?Total Procedure Duration: 0 hours 15 minutes 21 seconds  ?Findings:                 The perianal and digital rectal examinations were  ?                          normal. ?                          A continuous area of ulcerated mucosa was present  ?                          in the sigmoid colon. Biopsies were taken with a  ?                          cold forceps for histology. Verification of patient  ?                          identification for the specimen was done. Estimated  ?                          blood loss was minimal. ?                          The terminal ileum appeared normal. ?                          The exam was otherwise without abnormality on  ?                          direct and retroflexion views. ?                          Biopsies were taken with a cold forceps in the  ?                          rectum, in the distal  sigmoid colon, in the  ?                          descending colon, in the transverse colon, in the  ?                          ascending colon and in the cecum for histology.  ?                          Verification of patient identification for the  ?                          specimen was done. Estimated blood loss was minimal. ?Complications:            No immediate complications. ?Estimated Blood Loss:     Estimated blood loss was minimal. ?Impression:               -  Mucosal ulceration. Biopsied. 10 cm segment 35-45  ?                          cm from anus in sigmid colon. Looks like mildly  ?                          active ulcerative colitis. ?                          - The examined portion of the ileum was normal. ?                          - The examination was otherwise normal on direct  ?                          and retroflexion views. ?                          - Biopsies were taken with a cold forceps for  ?                          histology in the rectum, in the distal sigmoid  ?                          colon, in the descending colon, in the transverse  ?                          colon, in the ascending colon and in the cecum. ?Recommendation:           - Patient has a contact number available for  ?                          emergencies. The signs and symptoms of potential  ?                          delayed complications were discussed with the  ?                          patient. Return to normal activities tomorrow.  ?                          Written discharge instructions were provided to the  ?                          patient. ?                          - Resume previous diet. ?                          - Continue present medications. ?                          - Await pathology results. ?                          -  Repeat colonoscopy is recommended. The  ?                          colonoscopy date will be determined after pathology  ?                          results from today's exam  become available for  ?                          review. ?                          - get adalimumab trough levels and antibodies  ?                          (ordered) ?Gatha Mayer, MD ?02/19/2022 2:03:49 PM ?This report has been signed electronically. ?

## 2022-02-19 NOTE — Progress Notes (Signed)
History and Physical Interval Note: ? ?02/19/2022 ?1:30 PM ? ?Gloria Fox  has presented today for endoscopic procedure(s), with the diagnosis of  ?Encounter Diagnosis  ?Name Primary?  ? Left sided ulcerative colitis without complication ([place]) Yes  ?Marland Kitchen  The various methods of evaluation and treatment have been discussed with the patient and/or family. After consideration of risks, benefits and other options for treatment, the patient has consented to  the endoscopic procedure(s). ? ? The patient's history has been reviewed, patient examined, no change in status, stable for endoscopic procedure(s).  I have reviewed the patient's chart and labs.  Questions were answered to the patient's satisfaction.   ? ? ?Gatha Mayer, MD, [facility] ? ?

## 2022-02-19 NOTE — Patient Instructions (Addendum)
There is a 10 cm area of colon (sigmoid) with mildly active inflammation. ?All else is ok. ?Biopsies taken. ? ? ?I have ordered Humira (adalimumab) levels and antibody titer. ? ?Please come to the lab and have that drawn the day before or morning of next Humira dose (or as close as you can get to that). ? ?I appreciate the opportunity to care for you. ?Gatha Mayer, MD, [facility] ? ?YOU HAD AN ENDOSCOPIC PROCEDURE TODAY AT [facility]:   Refer to the procedure report that was given to you for any specific questions about what was found during the examination.  If the procedure report does not answer your questions, please call your gastroenterologist to clarify.  If you requested that your care partner not be given the details of your procedure findings, then the procedure report has been included in a sealed envelope for you to review at your convenience later. ? ?YOU SHOULD EXPECT: Some feelings of bloating in the abdomen. Passage of more gas than usual.  Walking can help get rid of the air that was put into your GI tract during the procedure and reduce the bloating. If you had a lower endoscopy (such as a colonoscopy or flexible sigmoidoscopy) you may notice spotting of blood in your stool or on the toilet paper. If you underwent a bowel prep for your procedure, you may not have a normal bowel movement for a few days. ? ?Please Note:  You might notice some irritation and congestion in your nose or some drainage.  This is from the oxygen used during your procedure.  There is no need for concern and it should clear up in a day or so. ? ?SYMPTOMS TO REPORT IMMEDIATELY: ? ?Following lower endoscopy (colonoscopy or flexible sigmoidoscopy): ? Excessive amounts of blood in the stool ? Significant tenderness or worsening of abdominal pains ? Swelling of the abdomen that is new, acute ? Fever of 100?F or higher ? ? ?For urgent or emergent issues, a gastroenterologist can be reached at any hour by calling  [phone]. ?Do not use MyChart messaging for urgent concerns.  ? ? ?DIET:  We do recommend a small meal at first, but then you may proceed to your regular diet.  Drink plenty of fluids but you should avoid alcoholic beverages for 24 hours. ? ?ACTIVITY:  You should plan to take it easy for the rest of today and you should NOT DRIVE or use heavy machinery until tomorrow (because of the sedation medicines used during the test).   ? ?FOLLOW UP: ?Our staff will call the number listed on your records 48-72 hours following your procedure to check on you and address any questions or concerns that you may have regarding the information given to you following your procedure. If we do not reach you, we will leave a message.  We will attempt to reach you two times.  During this call, we will ask if you have developed any symptoms of COVID 19. If you develop any symptoms (ie: fever, flu-like symptoms, shortness of breath, cough etc.) before then, please call [phone].  If you test positive for Covid 19 in the 2 weeks post procedure, please call and report this information to [place].   ? ?If any biopsies were taken you will be contacted by phone or by letter within the next 1-3 weeks.  Please call us at [phone] if you have not heard about the biopsies in 3 weeks.  ? ? ?SIGNATURES/CONFIDENTIALITY: ?You and/or  your care partner have signed paperwork which will be entered into your electronic medical record.  These signatures attest to the fact that that the information above on your After Visit Summary has been reviewed and is understood.  Full responsibility of the confidentiality of this discharge information lies with you and/or your care-partner.  ? ? ?

## 2022-02-23 ENCOUNTER — Telehealth: Payer: Self-pay

## 2022-02-23 NOTE — Telephone Encounter (Signed)
?  Follow up Call- ? ? ?  02/19/2022  ? 12:49 PM  ?Call back number  ?Post procedure Call Back phone  # [phone]  ?Permission to leave phone message Yes  ?  ? ?Patient questions: ? ?Do you have a fever, pain , or abdominal swelling? No. ?Pain Score  0 * ? ?Have you tolerated food without any problems? Yes.   ? ?Have you been able to return to your normal activities? Yes.   ? ?Do you have any questions about your discharge instructions: ?Diet   No. ?Medications  No. ?Follow up visit  No. ? ?Do you have questions or concerns about your Care? No. ? ?Actions: ?* If pain score is 4 or above: ?No action needed, pain <4. ? ? ?

## 2022-03-01 ENCOUNTER — Other Ambulatory Visit: Payer: BC Managed Care – PPO

## 2022-03-01 DIAGNOSIS — K5150 Left sided colitis without complications: Secondary | ICD-10-CM

## 2022-03-08 LAB — SERIAL MONITORING

## 2022-03-09 LAB — ADALIMUMAB+AB (SERIAL MONITOR)
Adalimumab Drug Level: 14 ug/mL
Anti-Adalimumab Antibody: <25 ng/mL

## 2022-03-15 ENCOUNTER — Encounter: Payer: Self-pay | Admitting: Internal Medicine

## 2022-03-16 ENCOUNTER — Other Ambulatory Visit: Payer: Self-pay | Admitting: Internal Medicine

## 2022-03-16 DIAGNOSIS — K51519 Left sided colitis with unspecified complications: Secondary | ICD-10-CM

## 2022-03-16 MED ORDER — MESALAMINE 1.2 G PO TBEC: 2.4000 g | DELAYED_RELEASE_TABLET | Freq: Every day | ORAL | 3 refills | Status: DC

## 2022-04-13 ENCOUNTER — Encounter: Payer: Self-pay | Admitting: Internal Medicine

## 2022-04-20 ENCOUNTER — Encounter: Payer: Self-pay | Admitting: Internal Medicine

## 2023-02-16 ENCOUNTER — Telehealth: Payer: Self-pay | Admitting: Family

## 2023-03-17 ENCOUNTER — Telehealth: Payer: Self-pay | Admitting: Pharmacy Technician

## 2023-03-17 ENCOUNTER — Other Ambulatory Visit (HOSPITAL_COMMUNITY): Payer: Self-pay

## 2023-03-17 NOTE — Telephone Encounter (Signed)
Patient Advocate Encounter  Received notification from [employer] that prior authorization for HUMIRA  is required.   PA submitted on 4.25.24 Key [id number] Status is pending

## 2023-03-25 ENCOUNTER — Other Ambulatory Visit (HOSPITAL_COMMUNITY): Payer: Self-pay

## 2023-03-25 NOTE — Telephone Encounter (Signed)
Patient Advocate Encounter  Prior Authorization for HUMIRA 40MG  has been approved with [employer].    PA# [id number] Effective dates: 3.26.24 through 5.1.25  Per [facility] test claim, ALREADY FILLED ON 4.4.24 VIA MAIL ORDER

## 2023-03-29 ENCOUNTER — Encounter: Payer: Self-pay | Admitting: Family

## 2023-03-29 ENCOUNTER — Ambulatory Visit (INDEPENDENT_AMBULATORY_CARE_PROVIDER_SITE_OTHER): Payer: BC Managed Care – PPO | Admitting: Family

## 2023-03-29 ENCOUNTER — Other Ambulatory Visit (HOSPITAL_COMMUNITY)
Admission: RE | Admit: 2023-03-29 | Discharge: 2023-03-29 | Disposition: A | Discharge status: Home / Self Care | Payer: BC Managed Care – PPO | Source: Ambulatory Visit | Attending: Family | Admitting: Family

## 2023-03-29 VITALS — BP 126/80 | HR 103 | Temp 98.2°F | Resp 16 | Ht 62.0 in | Wt 125.0 lb

## 2023-03-29 DIAGNOSIS — Z13220 Encounter for screening for lipoid disorders: Secondary | ICD-10-CM

## 2023-03-29 DIAGNOSIS — Z0001 Encounter for general adult medical examination with abnormal findings: Secondary | ICD-10-CM | POA: Diagnosis not present

## 2023-03-29 DIAGNOSIS — Z01419 Encounter for gynecological examination (general) (routine) without abnormal findings: Secondary | ICD-10-CM

## 2023-03-29 DIAGNOSIS — Z1231 Encounter for screening mammogram for malignant neoplasm of breast: Secondary | ICD-10-CM

## 2023-03-29 DIAGNOSIS — Z0000 Encounter for general adult medical examination without abnormal findings: Secondary | ICD-10-CM

## 2023-03-29 DIAGNOSIS — N852 Hypertrophy of uterus: Secondary | ICD-10-CM | POA: Diagnosis not present

## 2023-03-29 NOTE — Assessment & Plan Note (Signed)
New finding. Will obtain pelvic US for further evaluation.

## 2023-03-29 NOTE — Progress Notes (Addendum)
Subjective:   By signing my name below, I, Gloria Fox, attest that this documentation has been prepared under the direction and in the presence of Gloria Fillers, NP 03/29/23   Patient ID: Gloria Fox, female    DOB: [birth year], 47 y.o.   MRN: [id number]  Chief Complaint  Patient presents with   New Patient (Initial Visit)    HPI Patient is in today for a comprehensive physical exam.   Perimenopause: She reports having perimenopausal symptoms such as diaphoresis and hormonal changes before her period. She states her periods are otherwise normal and regular.   Acute: She denies having any fever, new muscle pain, new joint pain, new moles, congestion, sinus pain, sore throat, chest pain, palpitations, cough, SOB, wheezing, n/v/d, constipation, blood in stool, dysuria, frequency, hematuria, or headaches at this time.   Diet/Exercise: She stays active by walking. Diet is overall healthy. She avoids drinking sodas.   Last colonoscopy: 02/19/2022. Mucosal ulceration in sigmid colon.   Last mammogram: 03/06/2021. Results were normal.   Last pap: 09/15/2016. Results were normal. Pap was performed today.   Past Medical History:  Diagnosis Date   Allergy    SEASONAL   Anemia    Colitis 01/12/12   left colon   History of bursitis    right shoulder   Lactose intolerance 07/03/2018   By hx   Left breast mass 05/04/2019   Pseudoangiomatous stromal hyperplasia of breast    left   Sessile rectal polyp 01/12/12    Past Surgical History:  Procedure Laterality Date   BREAST EXCISIONAL BIOPSY Left 05/04/2019   BREAST LUMPECTOMY WITH RADIOACTIVE SEED LOCALIZATION Left 05/04/2019   Procedure: LEFT BREAST LUMPECTOMY WITH RADIOACTIVE SEED LOCALIZATION;  Surgeon: Claud Kelp, MD;  Location: [facility];  Service: General;  Laterality: Left;   COLONOSCOPY  01/12/2012   repeat 2019 - Left UC   CYSTECTOMY     reports hx of urethral cyst   URETHRAL CYST REMOVAL       Family History  Problem Relation Age of Onset   Hypertension Mother    Cancer Mother        sinus cancer   Hypertension Father    Hypertension Sister    Colon cancer Maternal Grandmother 66   Dementia Maternal Grandmother    Alcohol abuse Maternal Grandfather    Alcohol abuse Paternal Grandmother    Stomach cancer Neg Hx    Aphthous stomatitis Neg Hx    Colon polyps Neg Hx    Esophageal cancer Neg Hx     Social History   Socioeconomic History   Marital status: Married    Spouse name: Not on file   Number of children: 2   Years of education: Not on file   Highest education level: Not on file  Occupational History   Occupation: Academic librarian: [employer]  Tobacco Use   Smoking status: Never   Smokeless tobacco: Never  Vaping Use   Vaping Use: Never used  Substance and Sexual Activity   Alcohol use: No   Drug use: No   Sexual activity: Yes    Birth control/protection: Other-see comments    Comment: Vasectomy  Other Topics Concern   Not on file  Social History Narrative   Married   2 sons- 1999 and 2001 both are Therapist, occupational (third generation) like their father is   enjoys spending time with family, beach, mountains.    Never smoker/tobacco, no drugs, EtOH  Works for [employer] department   Social Determinants of Corporate investment banker Strain: Not on file  Food Insecurity: Not on file  Transportation Needs: Not on file  Physical Activity: Not on file  Stress: Not on file  Social Connections: Not on file  Intimate Partner Violence: Not on file    Outpatient Medications Prior to Visit  Medication Sig Dispense Refill   Adalimumab (HUMIRA PEN) 40 MG/0.4ML PNKT Inject 40 mg into the skin every 14 (fourteen) days. 2 each 11   hyoscyamine (LEVSIN SL) 0.125 MG SL tablet DISSOLVE 1 TABLET UNDER THE SKIN EVERY 4 HOURS AS NEEDED 90 tablet 3   loratadine (CLARITIN) 10 MG tablet Take 10 mg by mouth daily.     mesalamine (LIALDA) 1.2 g EC tablet Take  2 tablets (2.4 g total) by mouth daily with breakfast. 180 tablet 3   SUMAtriptan (IMITREX) 50 MG tablet Take 1 tablet (50 mg total) by mouth as needed for migraine. Take 1 tablet at the start of the migraine, may repeat in 2 hours as needed (max 2 tabs/24 hrs) 30 tablet 0   No facility-administered medications prior to visit.    Allergies  Allergen Reactions   Cephalexin Rash   Sulfa Drugs Cross Reactors Rash    ROS See HPI    Objective:    Physical Exam  BP 126/80 (BP Location: Right Arm, Patient Position: Sitting, Cuff Size: Small)   Pulse (!) 103   Temp 98.2 F (36.8 C) (Oral)   Resp 16   Ht 5\' 2"  (1.575 m)   Wt 125 lb (56.7 kg)   SpO2 100%   BMI 22.86 kg/m  Wt Readings from Last 3 Encounters:  03/29/23 125 lb (56.7 kg)  02/19/22 129 lb (58.5 kg)  02/01/22 129 lb 12.8 oz (58.9 kg)   Physical Exam  Constitutional: She is oriented to person, place, and time. She appears well-developed and well-nourished. No distress.  HENT:  Head: Normocephalic and atraumatic.  Right Ear: Tympanic membrane and ear canal normal.  Left Ear: Tympanic membrane and ear canal normal.  Mouth/Throat: Oropharynx is clear and moist.  Eyes: Pupils are equal, round, and reactive to light. No scleral icterus.  Neck: Normal range of motion. No thyromegaly present.  Cardiovascular: Normal rate and regular rhythm.   No murmur heard. Pulmonary/Chest: Effort normal and breath sounds normal. No respiratory distress. He has no wheezes. She has no rales. She exhibits no tenderness.  Abdominal: Soft. Bowel sounds are normal. She exhibits no distension and no mass. There is no tenderness. There is no rebound and no guarding.  Musculoskeletal: She exhibits no edema.  Lymphadenopathy:    She has no cervical adenopathy.  Neurological: She is alert and oriented to person, place, and time. She has normal patellar reflexes. She exhibits normal muscle tone. Coordination normal.  Skin: Skin is warm and dry.   Psychiatric: She has a normal mood and affect. Her behavior is normal. Judgment and thought content normal.  Breasts: Examined lying Right: Without masses, retractions, discharge or axillary adenopathy.  Left: Without masses, retractions, discharge or axillary adenopathy.  Inguinal/mons: Normal without inguinal adenopathy  External genitalia: Normal  BUS/Urethra/Skene's glands: Normal  Bladder: Normal  Vagina: Normal  Cervix: Normal  Uterus: enlarged, decreased mobility Adnexa/parametria:  Rt: Without masses or tenderness.  Lt: Without masses or tenderness.  Anus and perineum: Normal            Assessment & Plan:       Assessment &  Plan:  Encounter for general adult medical examination with abnormal findings Assessment & Plan: Continue healthy diet and regular exercise. Pap performed today. Refer for mammogram. Tetanus and colo up to date.    Encounter for routine gynecological examination with Papanicolaou smear of cervix -     Cytology - PAP  Breast cancer screening by mammogram -     3D Screening Mammogram, Left and Right; Future  Uterine enlargement Assessment & Plan: New finding. Will obtain pelvic US for further evaluation.   Orders: -     US PELVIC COMPLETE WITH TRANSVAGINAL; Future  Preventative health care Assessment & Plan: Continue healthy diet and regular exercise. Pap performed today. Refer for mammogram. Tetanus and colo up to date.   Orders: -     Comprehensive metabolic panel -     CBC with Differential/Platelet -     TSH -     Lipid panel     I,Rachel Rivera,acting as a scribe for Gloria Fillers, NP.,have documented all relevant documentation on the behalf of Gloria Fillers, NP,as directed by  Gloria Fillers, NP while in the presence of Gloria Fillers, NP.   I, Gloria Fillers, NP, personally preformed the services described in this documentation.  All medical record entries made by the scribe were at my  direction and in my presence.  I have reviewed the chart and discharge instructions (if applicable) and agree that the record reflects my personal performance and is accurate and complete. 03/29/23   Gloria Fillers, NP

## 2023-03-29 NOTE — Assessment & Plan Note (Addendum)
Continue healthy diet and regular exercise. Pap performed today. Refer for mammogram. Tetanus and colo up to date.

## 2023-03-30 LAB — COMPREHENSIVE METABOLIC PANEL
ALT: 13 U/L (ref 0–35)
AST: 16 U/L (ref 0–37)
Albumin: 4.3 g/dL (ref 3.5–5.2)
Alkaline Phosphatase: 60 U/L (ref 39–117)
BUN: 22 mg/dL (ref 6–23)
CO2: 22 mEq/L (ref 19–32)
Calcium: 9.2 mg/dL (ref 8.4–10.5)
Chloride: 104 mEq/L (ref 96–112)
Creatinine, Ser: 0.82 mg/dL (ref 0.40–1.20)
GFR: 85.64 mL/min (ref >60.00)
Glucose, Bld: 80 mg/dL (ref 70–99)
Potassium: 4.3 mEq/L (ref 3.5–5.1)
Sodium: 136 mEq/L (ref 135–145)
Total Bilirubin: 0.6 mg/dL (ref 0.2–1.2)
Total Protein: 7.2 g/dL (ref 6.0–8.3)

## 2023-03-30 LAB — CBC WITH DIFFERENTIAL/PLATELET
Basophils Absolute: 0.1 10*3/uL (ref 0.0–0.1)
Basophils Relative: 0.9 % (ref 0.0–3.0)
Eosinophils Absolute: 0.2 10*3/uL (ref 0.0–0.7)
Eosinophils Relative: 1.7 % (ref 0.0–5.0)
HCT: 45.7 % (ref 36.0–46.0)
Hemoglobin: 15.5 g/dL — ABNORMAL HIGH (ref 12.0–15.0)
Lymphocytes Relative: 31.1 % (ref 12.0–46.0)
Lymphs Abs: 2.9 10*3/uL (ref 0.7–4.0)
MCHC: 33.9 g/dL (ref 30.0–36.0)
MCV: 92.3 fl (ref 78.0–100.0)
Monocytes Absolute: 0.8 10*3/uL (ref 0.1–1.0)
Monocytes Relative: 8.6 % (ref 3.0–12.0)
Neutro Abs: 5.4 10*3/uL (ref 1.4–7.7)
Neutrophils Relative %: 57.7 % (ref 43.0–77.0)
Platelets: 221 10*3/uL (ref 150.0–400.0)
RBC: 4.95 Mil/uL (ref 3.87–5.11)
RDW: 13.9 % (ref 11.5–15.5)
WBC: 9.4 10*3/uL (ref 4.0–10.5)

## 2023-03-30 LAB — TSH: TSH: 2 u[IU]/mL (ref 0.35–5.50)

## 2023-03-30 LAB — LIPID PANEL
Cholesterol: 175 mg/dL (ref 0–200)
HDL: 46.8 mg/dL (ref >39.00)
LDL Cholesterol: 103 mg/dL — ABNORMAL HIGH (ref 0–99)
NonHDL: 128.49
Total CHOL/HDL Ratio: 4
Triglycerides: 128 mg/dL (ref 0.0–149.0)
VLDL: 25.6 mg/dL (ref 0.0–40.0)

## 2023-03-31 ENCOUNTER — Encounter (HOSPITAL_BASED_OUTPATIENT_CLINIC_OR_DEPARTMENT_OTHER): Payer: Self-pay

## 2023-03-31 ENCOUNTER — Ambulatory Visit (HOSPITAL_BASED_OUTPATIENT_CLINIC_OR_DEPARTMENT_OTHER)
Admission: RE | Admit: 2023-03-31 | Discharge: 2023-03-31 | Disposition: A | Discharge status: Home / Self Care | Payer: BC Managed Care – PPO | Source: Ambulatory Visit | Attending: Family | Admitting: Family

## 2023-03-31 DIAGNOSIS — N852 Hypertrophy of uterus: Secondary | ICD-10-CM | POA: Diagnosis not present

## 2023-03-31 DIAGNOSIS — D252 Subserosal leiomyoma of uterus: Secondary | ICD-10-CM | POA: Diagnosis not present

## 2023-03-31 DIAGNOSIS — Z1231 Encounter for screening mammogram for malignant neoplasm of breast: Secondary | ICD-10-CM | POA: Diagnosis not present

## 2023-04-01 LAB — CYTOLOGY - PAP
Adequacy: ABSENT
Comment: NEGATIVE
Diagnosis: NEGATIVE
High risk HPV: NEGATIVE

## 2023-05-16 ENCOUNTER — Telehealth: Payer: Self-pay | Admitting: Internal Medicine

## 2023-05-16 MED ORDER — HUMIRA (2 PEN) 40 MG/0.4ML ~~LOC~~ AJKT: 40.0000 mg | AUTO-INJECTOR | SUBCUTANEOUS | 1 refills | Status: DC

## 2023-05-16 NOTE — Telephone Encounter (Signed)
Patient is scheduled to see Dr Leone Payor at his next available appointment, 08/2023. I have placed refill on Humira to [facility] for continuation of therapy while she is awaiting her appointment.

## 2023-05-16 NOTE — Telephone Encounter (Signed)
Patient calling to schedule a yearly appointment with Dr Leone Payor, states she will need a humira renewal soon. Please advise

## 2023-05-19 ENCOUNTER — Other Ambulatory Visit: Payer: Self-pay

## 2023-05-19 MED ORDER — HUMIRA (2 PEN) 40 MG/0.4ML ~~LOC~~ AJKT: 40.0000 mg | AUTO-INJECTOR | SUBCUTANEOUS | 3 refills | Status: DC

## 2023-08-25 ENCOUNTER — Other Ambulatory Visit: Payer: BC Managed Care – PPO

## 2023-08-25 ENCOUNTER — Ambulatory Visit: Payer: BC Managed Care – PPO | Admitting: Internal Medicine

## 2023-08-25 ENCOUNTER — Encounter: Payer: Self-pay | Admitting: Internal Medicine

## 2023-08-25 VITALS — BP 118/72 | HR 97 | Ht 62.0 in | Wt 131.0 lb

## 2023-08-25 DIAGNOSIS — K5150 Left sided colitis without complications: Secondary | ICD-10-CM | POA: Diagnosis not present

## 2023-08-25 DIAGNOSIS — Z7960 Long term (current) use of unspecified immunomodulators and immunosuppressants: Secondary | ICD-10-CM

## 2023-08-25 MED ORDER — HYOSCYAMINE SULFATE 0.125 MG SL SUBL: SUBLINGUAL_TABLET | SUBLINGUAL | 1 refills | Status: AC

## 2023-08-25 NOTE — Progress Notes (Signed)
Gloria Fox 47 y.o. [birth year] [id number]  Assessment & Plan:   Encounter Diagnoses  Name Primary?   Left sided ulcerative colitis without complication ([facility]) Yes   Long-term use of immunosuppressant medication- Humira        Patient reports feeling well with no current symptoms. Not currently taking Lialda. On Humira every other week. Last colonoscopy in 2023 showed small area of inflammation. -Order fecal calprotectin to assess for inflammation. -Continue Humira as prescribed. -Remove Lialda from medication list.      Orders Placed This Encounter  Procedures   Calprotectin, Fecal   QuantiFERON-TB Gold Plus      Subjective:   Gastroenterology summary:  Left-sided ulcerative colitis-Diagnosed at colonoscopy February 2013 - symptoms began November 2012 Mesalamine 4.8 g daily started, Asacol HD - in remission 02/2012 Brief course of prednisone to control symptoms initially as well Flare in September and October 2013, back on course of prednisone, Canasa added Flare early 2019 - colitis in rectum/sigmoid - steroids Tx and resolved sxs - she has declined biologics now On Lialda 4.8 g/day and prn canasa Colonoscopy May 2019 with mild rectal and distal sigmoid inflammation. Recurrent steroids used early 2020.  Tested for Biologics, QuantiFERON T PMT testing okay.  Improved on Canasa so we will stick with Canasa and Lialda. Late 2020 flare restart prednisone plan for Biologics Humira initiated approximately March 2021, no 6-MP was used due to COVID pandemic and viral infection concerns Was doing well without mesalamine March 2022 but calprotectin was 764 March 2023 feeling well calprotectin 270 Colonoscopy 02/19/2022 with mildly active colitis 35 to 45 cm from anus, 10 cm segment.  Mesalamine recommended and prescribed as adalimumab levels 03/01/2022 were adequate with a drug level of 14 trough and no antibodies.   Lactose  intolerance  ---------------------------------------------------------------------------------------------- Discussed the use of AI scribe software for clinical note transcription with the patient, who gave verbal consent to proceed.    Chief Complaint: Follow-up of left-sided ulcerative colitis  HPI 47 year old white woman with the GI history as above who presents for follow-up last seen at her colonoscopy March 2023, she did not start Lialda.   She reports no complaints related to her bowel movements and denies any bleeding or pain. She describes her current health status as the best she has felt in years, attributing this improvement to Humira, which she refers to as a 'miracle drug.'  In addition to her ulcerative colitis, the patient also has a history of migraine headaches, for which she has been prescribed Imitrex. However, she reports that her headaches have been under control and she has not needed to use the medication recently.  The patient also has a prescription for Levsin, which she uses as needed for cramping. She has some old medication at home, but would like a refill 'just in case.' She estimates that a 60-pill prescription would last her about a year.  Lastly, the patient mentions that she has not been experiencing any other health issues.     Lab Results  Component Value Date   WBC 9.4 03/29/2023   HGB 15.5 (H) 03/29/2023   HCT 45.7 03/29/2023   MCV 92.3 03/29/2023   PLT 221.0 03/29/2023     Chemistry      Component Value Date/Time   NA 136 03/29/2023 1518   K 4.3 03/29/2023 1518   CL 104 03/29/2023 1518   CO2 22 03/29/2023 1518   BUN 22 03/29/2023 1518   CREATININE 0.82 03/29/2023 1518  Component Value Date/Time   CALCIUM 9.2 03/29/2023 1518   ALKPHOS 60 03/29/2023 1518   AST 16 03/29/2023 1518   ALT 13 03/29/2023 1518   BILITOT 0.6 03/29/2023 1518      Allergies  Allergen Reactions   Cephalexin Rash   Sulfa Drugs Cross Reactors Rash    Current Meds  Medication Sig   adalimumab (HUMIRA, 2 PEN,) 40 MG/0.4ML pen Inject 0.4 mLs (40 mg total) into the skin every 14 (fourteen) days.   hyoscyamine (LEVSIN SL) 0.125 MG SL tablet DISSOLVE 1 TABLET UNDER THE SKIN EVERY 4 HOURS AS NEEDED   Past Medical History:  Diagnosis Date   Allergy    SEASONAL   Anemia    Colitis 01/12/2012   left colon   History of bursitis    right shoulder   Lactose intolerance 07/03/2018   By hx   Left breast mass 05/04/2019   Pseudoangiomatous stromal hyperplasia of breast    left   Sessile rectal polyp 01/12/2012   Uterine fibroid    Past Surgical History:  Procedure Laterality Date   BREAST EXCISIONAL BIOPSY Left 05/04/2019   BREAST LUMPECTOMY WITH RADIOACTIVE SEED LOCALIZATION Left 05/04/2019   Procedure: LEFT BREAST LUMPECTOMY WITH RADIOACTIVE SEED LOCALIZATION;  Surgeon: Claud Kelp, MD;  Location: [facility];  Service: General;  Laterality: Left;   COLONOSCOPY  01/12/2012   repeat 2019 - Left UC   CYSTECTOMY     reports hx of urethral cyst   URETHRAL CYST REMOVAL     Social History   Social History Narrative   Married   2 sons- 1999 and 2001 both are Therapist, occupational (third generation) like their father is   enjoys spending time with family, beach, mountains.    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   Review of Systems  As per HPI Objective:   Physical Exam BP 118/72   Pulse 97   Ht 5\' 2"  (1.575 m)   Wt 131 lb (59.4 kg)   LMP 08/21/2023 (Exact Date)   SpO2 99%   BMI 23.96 kg/m    CHEST: Lungs clear to auscultation. CARDIOVASCULAR: Heart sounds normal. ABDOMEN: Abdomen soft, non-tender.

## 2023-08-25 NOTE — Patient Instructions (Signed)
Your provider has requested that you go to the basement level for lab work before leaving today. Press "B" on the elevator. The lab is located at the first door on the left as you exit the elevator.  Due to recent changes in healthcare laws, you may see the results of your imaging and laboratory studies on MyChart before your provider has had a chance to review them.  We understand that in some cases there may be results that are confusing or concerning to you. Not all laboratory results come back in the same time frame and the provider may be waiting for multiple results in order to interpret others.  Please give [place] 48 hours in order for your provider to thoroughly review all the results before contacting the office for clarification of your results.   We have sent the following medications to your pharmacy for you to pick up at your convenience: Generic Levsin to have on hand.  I appreciate the opportunity to care for you. Stan Head, MD, [facility]

## 2023-08-28 LAB — QUANTIFERON-TB GOLD PLUS
Mitogen-NIL: 8.38 [IU]/mL
NIL: 0.02 [IU]/mL
QuantiFERON-TB Gold Plus: NEGATIVE
TB1-NIL: 0 [IU]/mL
TB2-NIL: 0.01 [IU]/mL

## 2023-09-14 ENCOUNTER — Other Ambulatory Visit: Payer: BC Managed Care – PPO

## 2023-09-14 DIAGNOSIS — Z7960 Long term (current) use of unspecified immunomodulators and immunosuppressants: Secondary | ICD-10-CM

## 2023-09-14 DIAGNOSIS — K5150 Left sided colitis without complications: Secondary | ICD-10-CM

## 2023-09-16 LAB — CALPROTECTIN, FECAL: Calprotectin, Fecal: 112 ug/g (ref 0–120)

## 2024-03-13 ENCOUNTER — Telehealth: Payer: Self-pay

## 2024-03-13 ENCOUNTER — Other Ambulatory Visit (HOSPITAL_COMMUNITY): Payer: Self-pay

## 2024-03-13 NOTE — Telephone Encounter (Signed)
 Pharmacy Patient Advocate Encounter   Received notification from CoverMyMeds that prior authorization for Humira  (2 Pen) (CF) 40MG /0.4ML auto-injector kit is required/requested.   Insurance verification completed.   The patient is insured through [employer] .   Per test claim: PA required; PA submitted to above mentioned insurance via CoverMyMeds Key/confirmation #/EOC [id number] Status is pending

## 2024-03-14 ENCOUNTER — Encounter: Payer: Self-pay | Admitting: Internal Medicine

## 2024-03-14 NOTE — Telephone Encounter (Signed)
 Pharmacy Patient Advocate Encounter  Received notification from [employer] that Prior Authorization for Humira  (2 Pen) (CF) 40MG /0.4ML auto-injector kit has been APPROVED from 03-13-2024 to 03-13-2025   PA #/Case ID/Reference #: [id number]

## 2024-03-14 NOTE — Telephone Encounter (Signed)
 Pt made aware of the PA. Pt verbalized understanding with all questions answered.

## 2024-03-25 ENCOUNTER — Other Ambulatory Visit: Payer: Self-pay | Admitting: Internal Medicine

## 2024-03-29 ENCOUNTER — Other Ambulatory Visit (HOSPITAL_COMMUNITY): Payer: Self-pay

## 2024-03-30 ENCOUNTER — Encounter: Payer: BC Managed Care – PPO | Admitting: Family

## 2024-06-08 ENCOUNTER — Other Ambulatory Visit (HOSPITAL_COMMUNITY): Payer: Self-pay

## 2024-06-08 ENCOUNTER — Encounter: Payer: Self-pay | Admitting: Internal Medicine

## 2024-06-08 ENCOUNTER — Telehealth: Payer: Self-pay

## 2024-06-08 DIAGNOSIS — Z7960 Long term (current) use of unspecified immunomodulators and immunosuppressants: Secondary | ICD-10-CM

## 2024-06-08 DIAGNOSIS — K5150 Left sided colitis without complications: Secondary | ICD-10-CM

## 2024-06-08 MED ORDER — SIMLANDI (2 PEN) 40 MG/0.4ML ~~LOC~~ AJKT: 40.0000 mg | AUTO-INJECTOR | SUBCUTANEOUS | 11 refills | Status: DC

## 2024-06-08 NOTE — Telephone Encounter (Signed)
 I spoke with the patient. She reports that she received a message from her insurance company. According to the information she received the Simlandi/adalimumab -ryvk medication will be the cheapest option for her. She would like to know if it is possible to order this one.

## 2024-06-08 NOTE — Telephone Encounter (Signed)
 Pharmacy Patient Advocate Encounter   Received notification from Patient Advice Request messages that prior authorization for Humira  (2 Pen) (CF) 40MG /0.4ML auto-injector kit is required/requested.   Insurance verification completed.   The patient is insured through [employer] .   Per test claim: Prior Authorization form/request asks a question that requires your assistance. Please see the question below and advise accordingly. The PA will not be submitted until the necessary information is received.

## 2024-06-08 NOTE — Telephone Encounter (Signed)
 PA request has been xxx. New Encounter has been or will be created for follow up. For additional info see Pharmacy Prior Auth telephone encounter from 06-08-2024.

## 2024-06-08 NOTE — Telephone Encounter (Signed)
 Would you like the same dosage of 40 mg or do you want something different?

## 2024-06-08 NOTE — Telephone Encounter (Signed)
 Patient notified via my chart messages that rx was sent to pharmacy. Patient also notified to schedule appointment for September/October.

## 2024-06-08 NOTE — Telephone Encounter (Signed)
 OK to use the Simland may Rx x 1 years

## 2024-06-08 NOTE — Telephone Encounter (Signed)
 Please use Cyltezo 40mg /0.4 ml same sig as for Humira  and 11 refills  I pended the Rx for now  She needs appointment to see me in September please (or October if Sept doesn't work)  She needs a quantiferon TB test, CBC and CMET also please - these can be done in Sept or October before the visit

## 2024-06-08 NOTE — Telephone Encounter (Signed)
 Meds ordered this encounter  Medications   Adalimumab -ryvk (SIMLANDI, 2 PEN,) 40 MG/0.4ML AJKT    Sig: Inject 40 mg into the skin every 14 (fourteen) days.    Dispense:  1 each    Refill:  11    I sent the rx -

## 2024-06-13 ENCOUNTER — Other Ambulatory Visit: Payer: Self-pay

## 2024-08-17 ENCOUNTER — Other Ambulatory Visit: Payer: Self-pay | Admitting: Family

## 2024-08-17 DIAGNOSIS — Z1231 Encounter for screening mammogram for malignant neoplasm of breast: Secondary | ICD-10-CM

## 2024-08-21 ENCOUNTER — Ambulatory Visit (INDEPENDENT_AMBULATORY_CARE_PROVIDER_SITE_OTHER): Admitting: Medical

## 2024-08-21 VITALS — BP 140/90 | HR 93 | Temp 98.3°F | Resp 16 | Ht 62.0 in | Wt 129.4 lb

## 2024-08-21 DIAGNOSIS — B0239 Other herpes zoster eye disease: Secondary | ICD-10-CM | POA: Diagnosis not present

## 2024-08-21 DIAGNOSIS — R21 Rash and other nonspecific skin eruption: Secondary | ICD-10-CM | POA: Diagnosis not present

## 2024-08-21 MED ORDER — VALACYCLOVIR HCL 1 G PO TABS: 1000.0000 mg | ORAL_TABLET | Freq: Three times a day (TID) | ORAL | 0 refills | Status: AC

## 2024-08-21 NOTE — Progress Notes (Signed)
 Subjective:    Patient ID: Gloria Fox, female    DOB: [birth year], 48 y.o.   MRN: [id number]  HPI  Gloria Fox is a 48 year old female who presents with a swollen eyelid and ear pain.  She experienced a migraine starting on Wednesday, which worsened over the day. She took Imitrex , which she dislikes due to side effects such as nausea and dizziness, but it eventually alleviated the headache by Thursday. The headache lasted approximately 48 hours, and she still feels a low-level headache at a pain level of 2 out of 10.   On Saturday, she noticed swelling of her eyelid, which was less swollen in the morning of the visit compared to the previous day. She describes the swelling as causing pressure but not pain. She also reports random ear pain, which she describes as feeling like there's pain in her ear. No vision changes are noted. The redness around her eye started on Saturday, expanded downward by Sunday, and became more raised by Monday. She covered it with makeup for church on Sunday.Describe atypical sharp pain in her left ear and sharp pain in temporal area left side but no vision changes.  She has not received the Shingrix vaccine but has had chickenpox in the past. Pt is on simlandi   She started her menstrual period the night before the visit. She has been involved in helping her son with house-related activities but does not find it stressful.       Review of Systems  Constitutional:  Negative for chills and fever.  HENT:  Negative for congestion and ear pain.   Respiratory:  Negative for cough, chest tightness and wheezing.   Cardiovascular:  Negative for chest pain and palpitations.  Gastrointestinal:  Negative for abdominal pain, constipation, nausea and vomiting.  Genitourinary:  Negative for dysuria.  Musculoskeletal:  Negative for back pain and myalgias.  Skin:  Positive for rash.  Neurological:  Negative for dizziness, speech difficulty, weakness and  light-headedness.  Hematological:  Negative for adenopathy.  Psychiatric/Behavioral:  Negative for behavioral problems and decreased concentration.     Past Medical History:  Diagnosis Date   Allergy    SEASONAL   Anemia    Colitis 01/12/2012   left colon   History of bursitis    right shoulder   Lactose intolerance 07/03/2018   By hx   Left breast mass 05/04/2019   Pseudoangiomatous stromal hyperplasia of breast    left   Sessile rectal polyp 01/12/2012   Uterine fibroid      Social History   Socioeconomic History   Marital status: Married    Spouse name: Not on file   Number of children: 2   Years of education: Not on file   Highest education level: Bachelor's degree (e.g., BA, AB, BS)  Occupational History   Occupation: Academic librarian: [employer]  Tobacco Use   Smoking status: Never   Smokeless tobacco: Never  Vaping Use   Vaping status: Never Used  Substance and Sexual Activity   Alcohol use: No   Drug use: No   Sexual activity: Yes    Birth control/protection: Other-see comments    Comment: Vasectomy  Other Topics Concern   Not on file  Social History Narrative   Married   2 sons- 1999 and 2001 both are Therapist, occupational (third generation) like their father is   enjoys spending time with family, beach, mountains.    Never smoker/tobacco, no drugs, EtOH  Works for [employer]   Social Drivers of Health   Financial Resource Strain: Low Risk  (08/21/2024)   Overall Financial Resource Strain (CARDIA)    Difficulty of Paying Living Expenses: Not hard at all  Food Insecurity: No Food Insecurity (08/21/2024)   Hunger Vital Sign    Worried About Running Out of Food in the Last Year: Never true    Ran Out of Food in the Last Year: Never true  Transportation Needs: No Transportation Needs (08/21/2024)   PRAPARE - Administrator, Civil Service (Medical): No    Lack of Transportation (Non-Medical): No  Physical Activity: Insufficiently  Active (08/21/2024)   Exercise Vital Sign    Days of Exercise per Week: 5 days    Minutes of Exercise per Session: 10 min  Stress: No Stress Concern Present (08/21/2024)   [employer] of Occupational Health - Occupational Stress Questionnaire    Feeling of Stress: Not at all  Social Connections: Socially Integrated (08/21/2024)   Social Connection and Isolation Panel    Frequency of Communication with Friends and Family: More than three times a week    Frequency of Social Gatherings with Friends and Family: More than three times a week    Attends Religious Services: More than 4 times per year    Active Member of Clubs or Organizations: Yes    Attends Engineer, structural: More than 4 times per year    Marital Status: Married  Catering manager Violence: Not on file    Past Surgical History:  Procedure Laterality Date   BREAST EXCISIONAL BIOPSY Left 05/04/2019   BREAST LUMPECTOMY WITH RADIOACTIVE SEED LOCALIZATION Left 05/04/2019   Procedure: LEFT BREAST LUMPECTOMY WITH RADIOACTIVE SEED LOCALIZATION;  Surgeon: Gail Favorite, MD;  Location: [facility];  Service: General;  Laterality: Left;   COLONOSCOPY  01/12/2012   repeat 2019 - Left UC   CYSTECTOMY     reports hx of urethral cyst   URETHRAL CYST REMOVAL      Family History  Problem Relation Age of Onset   Hypertension Mother    Cancer Mother        sinus cancer   Hypertension Father    Hypertension Sister    Colon cancer Maternal Grandmother 69   Dementia Maternal Grandmother    Alcohol abuse Maternal Grandfather    Alcohol abuse Paternal Grandmother    Stomach cancer Neg Hx    Aphthous stomatitis Neg Hx    Colon polyps Neg Hx    Esophageal cancer Neg Hx     Allergies  Allergen Reactions   Cephalexin Rash   Sulfa Drugs Cross Reactors Rash    Current Outpatient Medications on File Prior to Visit  Medication Sig Dispense Refill   Adalimumab -ryvk (SIMLANDI , 2 PEN,) 40 MG/0.4ML AJKT  Inject 40 mg into the skin every 14 (fourteen) days. 1 each 11   hyoscyamine  (LEVSIN  SL) 0.125 MG SL tablet DISSOLVE 1 TABLET UNDER THE SKIN EVERY 4 HOURS AS NEEDED 60 tablet 1   loratadine (CLARITIN) 10 MG tablet Take 10 mg by mouth daily as needed.     SUMAtriptan  (IMITREX ) 50 MG tablet Take 1 tablet (50 mg total) by mouth as needed for migraine. Take 1 tablet at the start of the migraine, may repeat in 2 hours as needed (max 2 tabs/24 hrs) 30 tablet 0   No current facility-administered medications on file prior to visit.    BP (!) 140/90   Pulse 93  Temp 98.3 F (36.8 C) (Oral)   Resp 16   Ht 5' 2 (1.575 m)   Wt 129 lb 6.4 oz (58.7 kg)   LMP 08/07/2024 (Approximate)   SpO2 99%   BMI 23.67 kg/m         Objective:   Physical Exam  General Mental Status- Alert. General Appearance- Not in acute distress.   Skin General: Color- Normal Color. Moisture- Normal Moisture.  Neck No JVD.  Chest and Lung Exam Auscultation: Breath Sounds:-CTA  Cardiovascular Auscultation:Rythm- RRR Murmurs & Other Heart Sounds:Auscultation of the heart reveals- No Murmurs.  Abdomen Inspection:-Inspeection Normal. Palpation/Percussion:Note:No mass. Palpation and Percussion of the abdomen reveal- Non Tender, Non Distended + BS, no rebound or guarding.   Neurologic Cranial Nerve exam:- CN III-XII intact(No nystagmus), symmetric smile. Strength:- 5/5 equal and symmetric strength both upper and lower extremities.    Heent-no sinus pressure. Ears canals clear and normal tms. Left eye- Red rash around medial aspect of eye. Lt eye brow area and medial to bridge of nose. Rash on upper lid area as well  Conjunctiva clear. No dc.  Rt- eye normal.      Assessment & Plan:   Patient Instructions  Suspected herpes zoster ophthalmicus (shingles around left eye) . She has not received Shingrix. Condition at upper limit for optimal antiviral treatment initiation. Stress from recent migraine may  have contributed. - Prescribe Valtrex 1 gram TID for 7 days. - Refer to ophthalmology for evaluation. Asking if can see you by this Friday - Instruct to monitor and report eye changes.  - Suggest optometrist dilated exam if ophthalmology delayed. -Go ahead and schedule appointment with your pcp Thursday or Friday of this week.  Migraine headache Migraine much improve with Imitrex , but causes nausea and dizziness. Current headache mild.  Ear pain  -normal canal and tm. -I think pain you are having is nerve pain associated with shingles eruption  Follow up date to be determined based on Eye MD or optometrist appointment.   Jakerria Kingbird, PA-C

## 2024-08-21 NOTE — Patient Instructions (Addendum)
 Suspected herpes zoster ophthalmicus (shingles around left eye) . She has not received Shingrix. Condition at upper limit for optimal antiviral treatment initiation. Stress from recent migraine may have contributed. - Prescribe Valtrex 1 gram TID for 7 days. - Refer to ophthalmology for evaluation. Asking if can see you by this Friday - Instruct to monitor and report eye changes.  - Suggest optometrist dilated exam if ophthalmology delayed. -Go ahead and schedule appointment with your pcp Thursday or Friday of this week.  Migraine headache Migraine much improve with Imitrex , but causes nausea and dizziness. Current headache mild.  Ear pain  -normal canal and tm. -I think pain you are having is nerve pain associated with shingles eruption  Follow up date to be determined based on Eye MD or optometrist appointment.

## 2024-08-22 DIAGNOSIS — B0239 Other herpes zoster eye disease: Secondary | ICD-10-CM | POA: Diagnosis not present

## 2024-08-28 ENCOUNTER — Encounter

## 2024-08-28 DIAGNOSIS — Z1231 Encounter for screening mammogram for malignant neoplasm of breast: Secondary | ICD-10-CM

## 2024-08-31 ENCOUNTER — Ambulatory Visit: Admitting: Family

## 2024-09-19 ENCOUNTER — Encounter: Payer: Self-pay | Admitting: Family

## 2024-09-19 ENCOUNTER — Ambulatory Visit (INDEPENDENT_AMBULATORY_CARE_PROVIDER_SITE_OTHER): Admitting: Family

## 2024-09-19 VITALS — BP 137/74 | HR 80 | Temp 97.9°F | Resp 16 | Ht 62.0 in | Wt 128.4 lb

## 2024-09-19 DIAGNOSIS — Z5181 Encounter for therapeutic drug level monitoring: Secondary | ICD-10-CM

## 2024-09-19 DIAGNOSIS — Z111 Encounter for screening for respiratory tuberculosis: Secondary | ICD-10-CM | POA: Diagnosis not present

## 2024-09-19 DIAGNOSIS — Z0000 Encounter for general adult medical examination without abnormal findings: Secondary | ICD-10-CM

## 2024-09-19 DIAGNOSIS — Z0001 Encounter for general adult medical examination with abnormal findings: Secondary | ICD-10-CM

## 2024-09-19 LAB — CBC WITH DIFFERENTIAL/PLATELET
Basophils Absolute: 0 K/uL (ref 0.0–0.1)
Basophils Relative: 0.5 % (ref 0.0–3.0)
Eosinophils Absolute: 0.1 K/uL (ref 0.0–0.7)
Eosinophils Relative: 1.4 % (ref 0.0–5.0)
HCT: 43.5 % (ref 36.0–46.0)
Hemoglobin: 14.4 g/dL (ref 12.0–15.0)
Lymphocytes Relative: 29 % (ref 12.0–46.0)
Lymphs Abs: 2 K/uL (ref 0.7–4.0)
MCHC: 33.1 g/dL (ref 30.0–36.0)
MCV: 92.2 fl (ref 78.0–100.0)
Monocytes Absolute: 0.6 K/uL (ref 0.1–1.0)
Monocytes Relative: 8 % (ref 3.0–12.0)
Neutro Abs: 4.3 K/uL (ref 1.4–7.7)
Neutrophils Relative %: 61.1 % (ref 43.0–77.0)
Platelets: 209 K/uL (ref 150.0–400.0)
RBC: 4.71 Mil/uL (ref 3.87–5.11)
RDW: 14.8 % (ref 11.5–15.5)
WBC: 7 K/uL (ref 4.0–10.5)

## 2024-09-19 LAB — COMPREHENSIVE METABOLIC PANEL WITH GFR
ALT: 11 U/L (ref 0–35)
AST: 15 U/L (ref 0–37)
Albumin: 4.4 g/dL (ref 3.5–5.2)
Alkaline Phosphatase: 64 U/L (ref 39–117)
BUN: 18 mg/dL (ref 6–23)
CO2: 28 meq/L (ref 19–32)
Calcium: 9.2 mg/dL (ref 8.4–10.5)
Chloride: 104 meq/L (ref 96–112)
Creatinine, Ser: 0.72 mg/dL (ref 0.40–1.20)
GFR: 99.07 mL/min (ref >60.00)
Glucose, Bld: 84 mg/dL (ref 70–99)
Potassium: 4.6 meq/L (ref 3.5–5.1)
Sodium: 138 meq/L (ref 135–145)
Total Bilirubin: 0.5 mg/dL (ref 0.2–1.2)
Total Protein: 7 g/dL (ref 6.0–8.3)

## 2024-09-19 NOTE — Assessment & Plan Note (Signed)
 Continue healthy diet, exercise efforts. She will schedule pap, immunizations reviewed- declines covid vaccination.

## 2024-09-19 NOTE — Progress Notes (Signed)
 Subjective:     Patient ID: Gloria Fox, female    DOB: 1975-12-14, 48 y.o.   MRN: [id number]  Chief Complaint  Patient presents with   Annual Exam    HPI  Discussed the use of AI scribe software for clinical note transcription with the patient, who gave verbal consent to proceed.  History of Present Illness  Gloria Fox is a 48 year old female who presents for an annual physical exam.  She recently had shingles around her eye, which has improved but still causes itchiness and a lesion under her eyelid. She had a severe migraine before the shingles outbreak but has not had any since.  Her blood pressure was 138 mmHg, which she attributes to anxiety during medical visits. It is normal during visits to her gastroenterologist.  She has not had her mammogram due to concerns about lymph node involvement from shingles but plans to reschedule.  She experiences occasional stiffness in her left shoulder, similar to a frozen shoulder, which improves with exercise.  Her menstrual periods are regular, though the last one was slightly shorter. She occasionally wakes up feeling hot but does not have significant menopausal symptoms.  She walks her dogs three to four times a week and acknowledges the need to improve her exercise routine. She is up to date with her flu shot, hepatitis B series, vision, and dental check-ups.  She has no current cough, cold symptoms, leg swelling, digestive concerns, or urinary issues. No concerns about hearing or vision with glasses. No unusual muscle or joint pain beyond the shoulder stiffness. She has some concerns about depression or anxiety.  Immunizations: up to date, declines covid vaccine. Diet: healthy Exercise: needs improvement Colonoscopy: 3/23 Pap Smear:5/24 due 5/29 Mammogram: will reschedule Vision: up to date Dental: up to date      Health Maintenance Due  Topic Date Due   COVID-19 Vaccine (3 - 2025-26 season)  07/23/2024    Past Medical History:  Diagnosis Date   Allergy    SEASONAL   Anemia    Colitis 01/12/2012   left colon   History of bursitis    right shoulder   Lactose intolerance 07/03/2018   By hx   Left breast mass 05/04/2019   Pseudoangiomatous stromal hyperplasia of breast    left   Sessile rectal polyp 01/12/2012   Uterine fibroid     Past Surgical History:  Procedure Laterality Date   BREAST EXCISIONAL BIOPSY Left 05/04/2019   BREAST LUMPECTOMY WITH RADIOACTIVE SEED LOCALIZATION Left 05/04/2019   Procedure: LEFT BREAST LUMPECTOMY WITH RADIOACTIVE SEED LOCALIZATION;  Surgeon: Gail Favorite, MD;  Location: [facility];  Service: General;  Laterality: Left;   COLONOSCOPY  01/12/2012   repeat 2019 - Left UC   CYSTECTOMY     reports hx of urethral cyst   URETHRAL CYST REMOVAL      Family History  Problem Relation Age of Onset   Hypertension Mother    Cancer Mother        sinus cancer   Hypertension Father    Hypertension Sister    Colon cancer Maternal Grandmother 42   Dementia Maternal Grandmother    Alcohol abuse Maternal Grandfather    Alcohol abuse Paternal Grandmother    Stomach cancer Neg Hx    Aphthous stomatitis Neg Hx    Colon polyps Neg Hx    Esophageal cancer Neg Hx     Social History   Socioeconomic History   Marital status:  Married    Spouse name: Not on file   Number of children: 2   Years of education: Not on file   Highest education level: Bachelor's degree (e.g., BA, AB, BS)  Occupational History   Occupation: Academic Librarian: [employer]  Tobacco Use   Smoking status: Never   Smokeless tobacco: Never  Vaping Use   Vaping status: Never Used  Substance and Sexual Activity   Alcohol use: No   Drug use: No   Sexual activity: Yes    Birth control/protection: Other-see comments    Comment: Vasectomy  Other Topics Concern   Not on file  Social History Narrative   Married   2 sons- 1999 and 2001 both are statistician (third generation) like their father is   enjoys spending time with family, beach, mountains.    Never smoker/tobacco, no drugs, EtOH   Works for [employer]   Social Drivers of Health   Financial Resource Strain: Low Risk  (09/18/2024)   Overall Financial Resource Strain (CARDIA)    Difficulty of Paying Living Expenses: Not hard at all  Food Insecurity: No Food Insecurity (09/18/2024)   Hunger Vital Sign    Worried About Running Out of Food in the Last Year: Never true    Ran Out of Food in the Last Year: Never true  Transportation Needs: No Transportation Needs (09/18/2024)   PRAPARE - Administrator, Civil Service (Medical): No    Lack of Transportation (Non-Medical): No  Physical Activity: Insufficiently Active (09/18/2024)   Exercise Vital Sign    Days of Exercise per Week: 4 days    Minutes of Exercise per Session: 10 min  Stress: No Stress Concern Present (09/18/2024)   [employer] of Occupational Health - Occupational Stress Questionnaire    Feeling of Stress: Only a little  Social Connections: Socially Integrated (09/18/2024)   Social Connection and Isolation Panel    Frequency of Communication with Friends and Family: More than three times a week    Frequency of Social Gatherings with Friends and Family: More than three times a week    Attends Religious Services: More than 4 times per year    Active Member of [employer] or Organizations: Yes    Attends Engineer, Structural: More than 4 times per year    Marital Status: Married  Catering Manager Violence: Not on file    Outpatient Medications Prior to Visit  Medication Sig Dispense Refill   Adalimumab -adbm, 2 Pen, 40 MG/0.4ML AJKT      hyoscyamine  (LEVSIN  SL) 0.125 MG SL tablet DISSOLVE 1 TABLET UNDER THE SKIN EVERY 4 HOURS AS NEEDED 60 tablet 1   loratadine (CLARITIN) 10 MG tablet Take 10 mg by mouth daily as needed.     SUMAtriptan  (IMITREX ) 50 MG tablet Take 1 tablet (50 mg  total) by mouth as needed for migraine. Take 1 tablet at the start of the migraine, may repeat in 2 hours as needed (max 2 tabs/24 hrs) 30 tablet 0   Adalimumab -ryvk (SIMLANDI , 2 PEN,) 40 MG/0.4ML AJKT Inject 40 mg into the skin every 14 (fourteen) days. 1 each 11   No facility-administered medications prior to visit.    Allergies  Allergen Reactions   Cephalexin Rash   Sulfa Drugs Cross Reactors Rash    Review of Systems  Constitutional:  Negative for weight loss.  HENT:  Negative for congestion and hearing loss.   Eyes:  Negative for blurred vision.  Respiratory:  Negative for cough.   Cardiovascular:  Negative for leg swelling.  Gastrointestinal:  Negative for blood in stool, constipation and diarrhea.  Genitourinary:  Negative for dysuria and frequency.  Musculoskeletal:  Positive for joint pain (sometimes left shoulder pain). Negative for myalgias.  Skin:  Negative for rash.  Neurological:  Positive for headaches (one recent migraine).  Psychiatric/Behavioral:  Negative for depression. The patient is not nervous/anxious.        Objective:    Physical Exam   BP 137/74 (BP Location: Right Arm, Patient Position: Sitting, Cuff Size: Small)   Pulse 80   Temp 97.9 F (36.6 C) (Oral)   Resp 16   Ht 5' 2 (1.575 m)   Wt 128 lb 6.4 oz (58.2 kg)   LMP 08/07/2024 (Approximate)   SpO2 100%   BMI 23.48 kg/m  Wt Readings from Last 3 Encounters:  09/19/24 128 lb 6.4 oz (58.2 kg)  08/21/24 129 lb 6.4 oz (58.7 kg)  08/25/23 131 lb (59.4 kg)   Physical Exam  Constitutional: She is oriented to person, place, and time. She appears well-developed and well-nourished. No distress.  HENT:  Head: Normocephalic and atraumatic.  Right Ear: Tympanic membrane and ear canal normal.  Left Ear: Tympanic membrane and ear canal normal.  Mouth/Throat: Oropharynx is clear and moist.  Eyes: Pupils are equal, round, and reactive to light. No scleral icterus.  Neck: Normal range of motion. No  thyromegaly present.  Cardiovascular: Normal rate and regular rhythm.   No murmur heard. Pulmonary/Chest: Effort normal and breath sounds normal. No respiratory distress. He has no wheezes. She has no rales. She exhibits no tenderness.  Abdominal: Soft. Bowel sounds are normal. She exhibits no distension and no mass. There is no tenderness. There is no rebound and no guarding.  Musculoskeletal: She exhibits no edema.  Lymphadenopathy:    She has no cervical adenopathy.  Neurological: She is alert and oriented to person, place, and time. She has normal patellar reflexes. She exhibits normal muscle tone. Coordination normal.  Skin: Skin is warm and dry.  Psychiatric: She has a normal mood and affect. Her behavior is normal. Judgment and thought content normal.  Breast/Pelvic: deferred           Assessment & Plan:       Assessment & Plan:   Problem List Items Addressed This Visit       Unprioritized   Encounter for general adult medical examination with abnormal findings - Primary   Continue healthy diet, exercise efforts. She will schedule pap, immunizations reviewed- declines covid vaccination.        Other Visit Diagnoses       Screening for tuberculosis       Relevant Orders   QuantiFERON-TB Gold Plus     Therapeutic drug monitoring       Relevant Orders   Comp Met (CMET)   CBC w/Diff       I have discontinued Lizzette H. Hue's Simlandi  (2 Pen). I am also having her maintain her loratadine, SUMAtriptan , hyoscyamine , and Adalimumab -adbm (2 Pen).  No orders of the defined types were placed in this encounter.

## 2024-09-19 NOTE — Patient Instructions (Addendum)
 VISIT SUMMARY:  Today, you had your annual physical exam. We discussed your recent shingles outbreak, shoulder stiffness, and overall wellness, including your blood pressure and exercise routine.  YOUR PLAN:  SHINGLES INVOLVING THE EYE REGION: Your shingles have resolved but still cause some itching and a lesion under your eyelid.  LEFT SHOULDER STIFFNESS: You have occasional stiffness in your left shoulder, which improves with exercise. -Continue regular exercise to help with the stiffness.  ADULT WELLNESS VISIT: Your blood pressure was slightly elevated, likely due to anxiety. Your periods are regular, and you have occasional hot flashes. You are up to date on vaccinations and screenings and are interested in HIV and hepatitis C screening. -We will perform HIV and hepatitis C screening. -Continue regular exercise and maintain a healthy diet. -Continue with routine health maintenance and screenings.

## 2024-09-20 ENCOUNTER — Ambulatory Visit: Payer: Self-pay | Admitting: Family

## 2024-09-20 ENCOUNTER — Other Ambulatory Visit: Payer: Self-pay | Admitting: Family

## 2024-09-20 DIAGNOSIS — Z1231 Encounter for screening mammogram for malignant neoplasm of breast: Secondary | ICD-10-CM

## 2024-09-21 LAB — QUANTIFERON-TB GOLD PLUS
Mitogen-NIL: 4.12 [IU]/mL
NIL: 0.01 [IU]/mL
QuantiFERON-TB Gold Plus: NEGATIVE
TB1-NIL: 0 [IU]/mL
TB2-NIL: 0 [IU]/mL

## 2024-10-11 ENCOUNTER — Ambulatory Visit
Admission: RE | Admit: 2024-10-11 | Discharge: 2024-10-11 | Disposition: A | Discharge status: Home / Self Care | Source: Ambulatory Visit

## 2024-10-11 DIAGNOSIS — Z1231 Encounter for screening mammogram for malignant neoplasm of breast: Secondary | ICD-10-CM

## 2024-10-16 ENCOUNTER — Ambulatory Visit: Payer: Self-pay

## 2024-10-17 ENCOUNTER — Other Ambulatory Visit: Payer: Self-pay | Admitting: Family

## 2024-10-17 DIAGNOSIS — R928 Other abnormal and inconclusive findings on diagnostic imaging of breast: Secondary | ICD-10-CM

## 2024-10-23 ENCOUNTER — Ambulatory Visit: Admitting: Internal Medicine

## 2024-10-23 ENCOUNTER — Encounter: Payer: Self-pay | Admitting: Internal Medicine

## 2024-10-23 VITALS — BP 120/78 | HR 78 | Ht 62.0 in | Wt 129.0 lb

## 2024-10-23 DIAGNOSIS — Z7960 Long term (current) use of unspecified immunomodulators and immunosuppressants: Secondary | ICD-10-CM | POA: Diagnosis not present

## 2024-10-23 DIAGNOSIS — K5150 Left sided colitis without complications: Secondary | ICD-10-CM | POA: Diagnosis not present

## 2024-10-23 NOTE — Patient Instructions (Addendum)
 Your provider has requested that you go to the basement level for lab work before leaving today. Press B on the elevator. The lab is located at the first door on the left as you exit the elevator.  Due to recent changes in healthcare laws, you may see the results of your imaging and laboratory studies on MyChart before your provider has had a chance to review them.  We understand that in some cases there may be results that are confusing or concerning to you. Not all laboratory results come back in the same time frame and the provider may be waiting for multiple results in order to interpret others.  Please give us  48 hours in order for your provider to thoroughly review all the results before contacting the office for clarification of your results.   We will put you in the system for a colonoscopy recall for 01/2027.    I appreciate the opportunity to care for you. Lupita Commander, MD, [facility]

## 2024-10-23 NOTE — Progress Notes (Signed)
 Gloria Fox 48 y.o. [birth year] [id number]  Assessment & Plan:   Encounter Diagnoses  Name Primary?   Left sided ulcerative colitis without complication (HCC) Yes   Long-term use of immunosuppressant medication     Ulcerative colitis Signs and symptoms are well-controlled with adalimumab  every 2 weeks. Last colonoscopy in 2023 showed slight inflammation Review of current guidelines indicates she could go 5 years for next colonoscopy routinely.  Reassess with fecal calprotectin  Anticipate routine follow-up 1 year or sooner if needed  CC: Daryl Setter, NP   Subjective:  Left-sided ulcerative colitis-Diagnosed at colonoscopy February 2013 - symptoms began November 2012 Mesalamine  4.8 g daily started, Asacol  HD - in remission 02/2012 Brief course of prednisone  to control symptoms initially as well Flare in September and October 2013, back on course of prednisone , Canasa  added Flare early 2019 - colitis in rectum/sigmoid - steroids Tx and resolved sxs - she has declined biologics now On Lialda  4.8 g/day and prn canasa  Colonoscopy May 2019 with mild rectal and distal sigmoid inflammation. Recurrent steroids used early 2020.  Tested for Biologics, QuantiFERON T PMT testing okay.  Improved on Canasa  so we will stick with Canasa  and Lialda . Late 2020 flare restart prednisone  plan for Biologics Humira  initiated approximately March 2021, no 6-MP was used due to COVID pandemic and viral infection concerns Was doing well without mesalamine  March 2022 but calprotectin was 764 March 2023 feeling well calprotectin 270 Colonoscopy 02/19/2022 with mildly active colitis 35 to 45 cm from anus, 10 cm segment.  Mesalamine  recommended and prescribed as adalimumab  levels 03/01/2022 were adequate with a drug level of 14 trough and no antibodies. 2024 stopped mesalamine    Lactose  intolerance -------------------------------------------------------------------------------------------  Chief Complaint: Follow-up of ulcerative colitis on adalimumab   HPI 48 year old woman with left-sided ulcerative colitis on adalimumab  here for annual follow-up.  Discussed the use of AI scribe software for clinical note transcription with the patient, who gave verbal consent to proceed.   Ulcerative colitis symptom control - 12-year history of ulcerative colitis, diagnosed in 2013 - Currently on adalimumab  therapy - No adverse effects from adalimumab , including no rashes - Symptoms are well-controlled - No abdominal cramping, rectal bleeding, or abnormal bowel movements - Describes overall well-being as 'awesome' and better than expected  Disease surveillance and monitoring - Last colonoscopy performed in 2023 - Calprotectin levels were borderline in the previous year       Lab Results  Component Value Date   WBC 7.0 09/19/2024   HGB 14.4 09/19/2024   HCT 43.5 09/19/2024   MCV 92.2 09/19/2024   PLT 209.0 09/19/2024    Lab Results  Component Value Date   NA 138 09/19/2024   CL 104 09/19/2024   K 4.6 09/19/2024   CO2 28 09/19/2024   BUN 18 09/19/2024   CREATININE 0.72 09/19/2024   GFR 99.07 09/19/2024   CALCIUM 9.2 09/19/2024   ALBUMIN 4.4 09/19/2024   GLUCOSE 84 09/19/2024     Lab Results  Component Value Date   ALT 11 09/19/2024   AST 15 09/19/2024   ALKPHOS 64 09/19/2024   BILITOT 0.5 09/19/2024   QuantiFERON testing for TB - October 2025 Allergies  Allergen Reactions   Cephalexin Rash   Sulfa Drugs Cross Reactors Rash   Current Meds  Medication Sig   Adalimumab -adbm, 2 Pen, 40 MG/0.4ML AJKT    hyoscyamine  (LEVSIN  SL) 0.125 MG SL tablet DISSOLVE 1 TABLET UNDER THE SKIN EVERY 4 HOURS AS NEEDED   loratadine (CLARITIN) 10  MG tablet Take 10 mg by mouth daily as needed.   SUMAtriptan  (IMITREX ) 50 MG tablet Take 1 tablet (50 mg total) by mouth as  needed for migraine. Take 1 tablet at the start of the migraine, may repeat in 2 hours as needed (max 2 tabs/24 hrs)   Past Medical History:  Diagnosis Date   Allergy    SEASONAL   Anemia    Colitis 01/12/2012   left colon   History of bursitis    right shoulder   Lactose intolerance 07/03/2018   By hx   Left breast mass 05/04/2019   Pseudoangiomatous stromal hyperplasia of breast    left   Sessile rectal polyp 01/12/2012   Uterine fibroid    Past Surgical History:  Procedure Laterality Date   BREAST EXCISIONAL BIOPSY Left 05/04/2019   BREAST LUMPECTOMY WITH RADIOACTIVE SEED LOCALIZATION Left 05/04/2019   Procedure: LEFT BREAST LUMPECTOMY WITH RADIOACTIVE SEED LOCALIZATION;  Surgeon: Gail Favorite, MD;  Location: [facility];  Service: General;  Laterality: Left;   COLONOSCOPY  01/12/2012   repeat 2019 - Left UC   CYSTECTOMY     reports hx of urethral cyst   URETHRAL CYST REMOVAL     Social History   Social History Narrative   Married   2 sons- 1999 and 2001 both are therapist, occupational (third generation) like their father is   enjoys spending time with family, beach, mountains.    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   Review of Systems As above  Objective:   Physical Exam @BP  120/78   Pulse 78   Ht 5' 2 (1.575 m)   Wt 129 lb (58.5 kg)   LMP 10/02/2024   BMI 23.59 kg/m @  General:  NAD Eyes:   anicteric Lungs:  clear Heart::  S1S2 no rubs, murmurs or gallops Abdomen:  soft and nontender, BS+ Ext:   no edema, cyanosis or clubbing    Data Reviewed:  See HPI

## 2024-10-25 ENCOUNTER — Other Ambulatory Visit

## 2024-10-25 DIAGNOSIS — K5150 Left sided colitis without complications: Secondary | ICD-10-CM

## 2024-10-29 ENCOUNTER — Ambulatory Visit
Admission: RE | Admit: 2024-10-29 | Discharge: 2024-10-29 | Disposition: A | Source: Ambulatory Visit | Attending: Family | Admitting: Family

## 2024-10-29 ENCOUNTER — Other Ambulatory Visit: Payer: Self-pay | Admitting: Family

## 2024-10-29 DIAGNOSIS — R928 Other abnormal and inconclusive findings on diagnostic imaging of breast: Secondary | ICD-10-CM

## 2024-10-29 DIAGNOSIS — R921 Mammographic calcification found on diagnostic imaging of breast: Secondary | ICD-10-CM | POA: Diagnosis not present

## 2024-10-30 LAB — CALPROTECTIN, FECAL: Calprotectin, Fecal: 120 ug/g (ref 0–120)

## 2024-10-31 ENCOUNTER — Inpatient Hospital Stay: Admission: RE | Admit: 2024-10-31 | Discharge: 2024-10-31 | Attending: Family | Admitting: Family

## 2024-10-31 ENCOUNTER — Ambulatory Visit: Payer: Self-pay | Admitting: Internal Medicine

## 2024-10-31 ENCOUNTER — Other Ambulatory Visit: Payer: Self-pay | Admitting: Interventional Radiology

## 2024-10-31 ENCOUNTER — Ambulatory Visit
Admission: RE | Admit: 2024-10-31 | Discharge: 2024-10-31 | Disposition: A | Discharge status: Home / Self Care | Source: Ambulatory Visit | Attending: Family | Admitting: Family

## 2024-10-31 DIAGNOSIS — R921 Mammographic calcification found on diagnostic imaging of breast: Secondary | ICD-10-CM

## 2024-10-31 DIAGNOSIS — N6012 Diffuse cystic mastopathy of left breast: Secondary | ICD-10-CM | POA: Diagnosis not present

## 2024-10-31 DIAGNOSIS — R928 Other abnormal and inconclusive findings on diagnostic imaging of breast: Secondary | ICD-10-CM

## 2024-10-31 HISTORY — PX: BREAST BIOPSY: SHX20

## 2024-11-01 LAB — SURGICAL PATHOLOGY

## 2025-09-20 ENCOUNTER — Encounter: Admitting: Family
